# Patient Record
Sex: Female | Born: 1970 | Race: Black or African American | Hispanic: No | Marital: Married | Smoking: Never smoker
Health system: Southern US, Community
[De-identification: ages and names within clinical notes are randomized; demographics above are authoritative.]

## PROBLEM LIST (undated history)

## (undated) DIAGNOSIS — I10 Essential (primary) hypertension: Secondary | ICD-10-CM

## (undated) DIAGNOSIS — E669 Obesity, unspecified: Secondary | ICD-10-CM

## (undated) DIAGNOSIS — Z87448 Personal history of other diseases of urinary system: Secondary | ICD-10-CM

---
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
## 1998-10-08 ENCOUNTER — Emergency Department (HOSPITAL_COMMUNITY): Admission: EM | Admit: 1998-10-08 | Discharge: 1998-10-08 | Payer: Self-pay | Admitting: Emergency Medicine

## 1998-10-08 ENCOUNTER — Encounter: Payer: Self-pay | Admitting: Emergency Medicine

## 2000-05-27 ENCOUNTER — Other Ambulatory Visit: Admission: RE | Admit: 2000-05-27 | Discharge: 2000-05-27 | Payer: Self-pay | Admitting: Obstetrics and Gynecology

## 2000-07-05 ENCOUNTER — Ambulatory Visit (HOSPITAL_COMMUNITY): Admission: RE | Admit: 2000-07-05 | Discharge: 2000-07-05 | Payer: Self-pay | Admitting: Obstetrics and Gynecology

## 2000-07-05 ENCOUNTER — Encounter: Payer: Self-pay | Admitting: Obstetrics and Gynecology

## 2000-08-03 ENCOUNTER — Encounter: Payer: Self-pay | Admitting: Obstetrics and Gynecology

## 2000-08-03 ENCOUNTER — Ambulatory Visit (HOSPITAL_COMMUNITY): Admission: RE | Admit: 2000-08-03 | Discharge: 2000-08-03 | Payer: Self-pay | Admitting: Obstetrics and Gynecology

## 2000-10-06 ENCOUNTER — Encounter: Payer: Self-pay | Admitting: Obstetrics and Gynecology

## 2000-10-06 ENCOUNTER — Ambulatory Visit (HOSPITAL_COMMUNITY): Admission: RE | Admit: 2000-10-06 | Discharge: 2000-10-06 | Payer: Self-pay | Admitting: Obstetrics and Gynecology

## 2000-10-17 ENCOUNTER — Inpatient Hospital Stay (HOSPITAL_COMMUNITY): Admission: AD | Admit: 2000-10-17 | Discharge: 2000-10-19 | Payer: Self-pay | Admitting: Obstetrics and Gynecology

## 2001-12-21 HISTORY — PX: ABDOMINAL HYSTERECTOMY: SHX81

## 2003-01-23 ENCOUNTER — Encounter: Admission: RE | Admit: 2003-01-23 | Discharge: 2003-01-23 | Payer: Self-pay | Admitting: Obstetrics and Gynecology

## 2003-01-25 ENCOUNTER — Ambulatory Visit (HOSPITAL_COMMUNITY): Admission: RE | Admit: 2003-01-25 | Discharge: 2003-01-25 | Payer: Self-pay | Admitting: *Deleted

## 2003-02-13 ENCOUNTER — Encounter: Admission: RE | Admit: 2003-02-13 | Discharge: 2003-02-13 | Payer: Self-pay | Admitting: Obstetrics and Gynecology

## 2003-03-13 ENCOUNTER — Encounter: Admission: RE | Admit: 2003-03-13 | Discharge: 2003-03-13 | Payer: Self-pay | Admitting: Obstetrics and Gynecology

## 2003-05-03 ENCOUNTER — Inpatient Hospital Stay (HOSPITAL_COMMUNITY): Admission: RE | Admit: 2003-05-03 | Discharge: 2003-05-05 | Payer: Self-pay | Admitting: Obstetrics and Gynecology

## 2003-05-08 ENCOUNTER — Inpatient Hospital Stay (HOSPITAL_COMMUNITY): Admission: AD | Admit: 2003-05-08 | Discharge: 2003-05-08 | Payer: Self-pay | Admitting: *Deleted

## 2004-06-21 ENCOUNTER — Emergency Department (HOSPITAL_COMMUNITY): Admission: EM | Admit: 2004-06-21 | Discharge: 2004-06-21 | Payer: Self-pay | Admitting: Emergency Medicine

## 2007-04-14 ENCOUNTER — Emergency Department (HOSPITAL_COMMUNITY): Admission: EM | Admit: 2007-04-14 | Discharge: 2007-04-14 | Payer: Self-pay | Admitting: Family Medicine

## 2013-08-08 ENCOUNTER — Emergency Department (HOSPITAL_COMMUNITY)
Admission: EM | Admit: 2013-08-08 | Discharge: 2013-08-08 | Disposition: A | Discharge status: Home / Self Care | Payer: 59 | Attending: Emergency Medicine | Admitting: Emergency Medicine

## 2013-08-08 ENCOUNTER — Emergency Department (HOSPITAL_COMMUNITY): Payer: 59

## 2013-08-08 ENCOUNTER — Encounter (HOSPITAL_COMMUNITY): Payer: Self-pay | Admitting: *Deleted

## 2013-08-08 DIAGNOSIS — Z90710 Acquired absence of both cervix and uterus: Secondary | ICD-10-CM | POA: Insufficient documentation

## 2013-08-08 DIAGNOSIS — I10 Essential (primary) hypertension: Secondary | ICD-10-CM | POA: Insufficient documentation

## 2013-08-08 DIAGNOSIS — Z91199 Patient's noncompliance with other medical treatment and regimen due to unspecified reason: Secondary | ICD-10-CM | POA: Insufficient documentation

## 2013-08-08 DIAGNOSIS — K8020 Calculus of gallbladder without cholecystitis without obstruction: Secondary | ICD-10-CM | POA: Insufficient documentation

## 2013-08-08 DIAGNOSIS — R112 Nausea with vomiting, unspecified: Secondary | ICD-10-CM | POA: Insufficient documentation

## 2013-08-08 DIAGNOSIS — Z9119 Patient's noncompliance with other medical treatment and regimen: Secondary | ICD-10-CM | POA: Insufficient documentation

## 2013-08-08 HISTORY — DX: Essential (primary) hypertension: I10

## 2013-08-08 LAB — COMPREHENSIVE METABOLIC PANEL
AST: 22 U/L (ref 0–37)
Albumin: 3.9 g/dL (ref 3.5–5.2)
Alkaline Phosphatase: 81 U/L (ref 39–117)
BUN: 10 mg/dL (ref 6–23)
Creatinine, Ser: 0.84 mg/dL (ref 0.50–1.10)
Potassium: 3.3 mEq/L — ABNORMAL LOW (ref 3.5–5.1)
Total Protein: 8.1 g/dL (ref 6.0–8.3)

## 2013-08-08 LAB — URINE MICROSCOPIC-ADD ON

## 2013-08-08 LAB — URINALYSIS, ROUTINE W REFLEX MICROSCOPIC
Glucose, UA: NEGATIVE mg/dL
Leukocytes, UA: NEGATIVE
Nitrite: NEGATIVE
Specific Gravity, Urine: 1.011 (ref 1.005–1.030)
pH: 7.5 (ref 5.0–8.0)

## 2013-08-08 LAB — CBC WITH DIFFERENTIAL/PLATELET
Basophils Absolute: 0 10*3/uL (ref 0.0–0.1)
Basophils Relative: 0 % (ref 0–1)
Eosinophils Absolute: 0 10*3/uL (ref 0.0–0.7)
Hemoglobin: 12.7 g/dL (ref 12.0–15.0)
MCH: 28.4 pg (ref 26.0–34.0)
MCHC: 33.2 g/dL (ref 30.0–36.0)
Monocytes Relative: 5 % (ref 3–12)
Neutro Abs: 7.4 10*3/uL (ref 1.7–7.7)
Neutrophils Relative %: 76 % (ref 43–77)
RDW: 13.9 % (ref 11.5–15.5)

## 2013-08-08 LAB — LIPASE, BLOOD: Lipase: 27 U/L (ref 11–59)

## 2013-08-08 MED ORDER — ONDANSETRON HCL 4 MG PO TABS: 4.0000 mg | ORAL_TABLET | Freq: Three times a day (TID) | ORAL | Status: DC | PRN

## 2013-08-08 MED ORDER — ONDANSETRON HCL 4 MG/2ML IJ SOLN
4.0000 mg | Freq: Once | INTRAMUSCULAR | Status: AC
  Administered 2013-08-08: 4 mg via INTRAVENOUS
  Filled 2013-08-08: qty 2

## 2013-08-08 MED ORDER — HYDROMORPHONE HCL PF 1 MG/ML IJ SOLN
1.0000 mg | Freq: Once | INTRAMUSCULAR | Status: AC
  Administered 2013-08-08: 1 mg via INTRAVENOUS
  Filled 2013-08-08: qty 1

## 2013-08-08 MED ORDER — OXYCODONE-ACETAMINOPHEN 5-325 MG PO TABS: 1.0000 | ORAL_TABLET | Freq: Four times a day (QID) | ORAL | Status: DC | PRN

## 2013-08-08 MED ORDER — SODIUM CHLORIDE 0.9 % IV BOLUS (SEPSIS)
1000.0000 mL | Freq: Once | INTRAVENOUS | Status: AC
  Administered 2013-08-08: 1000 mL via INTRAVENOUS

## 2013-08-08 NOTE — ED Provider Notes (Signed)
CSN: [id number]     Arrival date & time 08/08/13  0219 History     First MD Initiated Contact with Patient 08/08/13 0224     Chief Complaint  Patient presents with  . Abdominal Pain   (Consider location/radiation/quality/duration/timing/severity/associated sxs/prior Treatment) Patient is a 42 y.o. female presenting with abdominal pain.  Abdominal Pain  Pt with history of HTN (noncompliant with meds) reports sudden onset of severe aching gnawing epigastric pain around 1800hrs radiating into back, associated with N/V but no diarrhea or dysuria. Unable to find a comfortable position. No history of same, drank wine over the weekend but is not an every day drinker. Denies any known history of gall stones. No fever.   Past Medical History  Diagnosis Date  . Hypertension    Past Surgical History  Procedure Laterality Date  . Abdominal hysterectomy     History reviewed. No pertinent family history. History  Substance Use Topics  . Smoking status: Never Smoker   . Smokeless tobacco: Not on file  . Alcohol Use: Yes   OB History   Grav Para Term Preterm Abortions TAB SAB Ect Mult Living                 Review of Systems  Gastrointestinal: Positive for abdominal pain.   All other systems reviewed and are negative except as noted in HPI.   Allergies  Review of patient's allergies indicates no known allergies.  Home Medications  No current outpatient prescriptions on file. BP 153/103  Pulse 76  Temp(Src) 97.7 F (36.5 C) (Oral)  Resp 16  SpO2 99% Physical Exam  Nursing note and vitals reviewed. Constitutional: She is oriented to person, place, and time. She appears well-developed and well-nourished.  HENT:  Head: Normocephalic and atraumatic.  Eyes: EOM are normal. Pupils are equal, round, and reactive to light.  Neck: Normal range of motion. Neck supple.  Cardiovascular: Normal rate, normal heart sounds and intact distal pulses.   Pulmonary/Chest: Effort normal and  breath sounds normal.  Abdominal: Bowel sounds are normal. She exhibits no distension. There is tenderness (epigastric moderate, RUQ mild, neg Murphy's sign). There is guarding. There is no rebound.  Musculoskeletal: Normal range of motion. She exhibits no edema and no tenderness.  Neurological: She is alert and oriented to person, place, and time. She has normal strength. No cranial nerve deficit or sensory deficit.  Skin: Skin is warm and dry. No rash noted.  Psychiatric: She has a normal mood and affect.    ED Course   Procedures (including critical care time)  Labs Reviewed  COMPREHENSIVE METABOLIC PANEL - Abnormal; Notable for the following:    Potassium 3.3 (*)    Glucose, Bld 125 (*)    GFR calc non Af Amer 86 (*)    All other components within normal limits  URINALYSIS, ROUTINE W REFLEX MICROSCOPIC - Abnormal; Notable for the following:    Hgb urine dipstick MODERATE (*)    Protein, ur 100 (*)    All other components within normal limits  URINE MICROSCOPIC-ADD ON - Abnormal; Notable for the following:    Squamous Epithelial / LPF FEW (*)    Bacteria, UA FEW (*)    All other components within normal limits  CBC WITH DIFFERENTIAL  LIPASE, BLOOD   US Abdomen Complete  08/08/2013   *RADIOLOGY REPORT*  Clinical Data:  Epigastric pain.  COMPLETE ABDOMINAL ULTRASOUND  Comparison:  None.  Findings:  Gallbladder:  Multiple stones in the dependent portion of  the gallbladder.  No gallbladder wall thickening or edema.  Murphy's sign is negative.  Common bile duct:  Normal caliber.  Diameter is measured at 4.6 mm.  Liver:  Mild increased liver echotexture diffusely suggesting fatty infiltration.  No focal lesions identified.  IVC:  Appears normal.  Pancreas:  No focal abnormality seen.  Spleen:  Spleen length measures 8.6 cm.  Normal parenchymal echotexture.  Right Kidney:  Right kidney measures 12.5 cm length.  No hydronephrosis.  Left Kidney:  Left kidney measures 12 cm length.  No  hydronephrosis.  Abdominal aorta:  Visualized abdominal aorta is normal in caliber. Distally aorta is obscured by bowel gas.  IMPRESSION: Cholelithiasis.  Probable fatty infiltration of the liver.   Original Report Authenticated By: Burman Nieves, M.D.   1. Cholelithiasis     MDM   Date: 08/08/2013  Rate: 78  Rhythm: normal sinus rhythm  QRS Axis: normal  Intervals: normal  ST/T Wave abnormalities: nonspecific T wave changes  Conduction Disutrbances:none  Narrative Interpretation:   Old EKG Reviewed: none available  Pain improved. Labs unremarkable, US shows gall stones but no signs of cholecystitis. Advised to followup with Gen Surg.    Charles B. Bernette Mayers, MD 08/08/13 [phone]

## 2013-08-08 NOTE — ED Notes (Signed)
Patient provided urine sample without catheterization. Sample sent to lab.

## 2013-08-08 NOTE — ED Notes (Signed)
Pt complaining of pain that starts in her back and radiates to her epigastric region. Pt states that the pain has been going on since 18:00 last night. Pt states vomiting, no SOB. Pt states has had reflux before and this pain has lasted longer. Pt out of BP medications.

## 2013-08-17 ENCOUNTER — Ambulatory Visit (INDEPENDENT_AMBULATORY_CARE_PROVIDER_SITE_OTHER): Payer: 59 | Admitting: Surgery

## 2013-08-17 ENCOUNTER — Encounter (INDEPENDENT_AMBULATORY_CARE_PROVIDER_SITE_OTHER): Payer: Self-pay | Admitting: Surgery

## 2013-08-17 VITALS — BP 152/96 | HR 72 | Resp 16 | Ht 67.5 in | Wt 331.0 lb

## 2013-08-17 DIAGNOSIS — K8020 Calculus of gallbladder without cholecystitis without obstruction: Secondary | ICD-10-CM

## 2013-08-17 NOTE — Patient Instructions (Signed)

## 2013-08-17 NOTE — Progress Notes (Signed)
Patient ID: Allison Mays, female   DOB: June 19, 1971, 42 y.o.   MRN: [id number]  No chief complaint on file.   HPI Allison Mays is a 42 y.o. female.  Patient sent at the request of Dr. Bernette Mayers for right upper quadrant pain. She was seen 10 days ago with the sudden onset of sharp epigastric and right upper quadrant pain. Ultrasound scan which showed gallstones. She was managed medically and sent home and has done well since. She has had no significant episodes of pain like this before. She had some heartburn Christmastime which he thinks may have been related to this as well. She is asymptomatic currently. Symptoms not made worse by dietary changes. HPI  Past Medical History  Diagnosis Date  . Hypertension     Past Surgical History  Procedure Laterality Date  . Abdominal hysterectomy      Family History  Problem Relation Age of Onset  . Anuerysm Father     Social History History  Substance Use Topics  . Smoking status: Never Smoker   . Smokeless tobacco: Not on file  . Alcohol Use: Yes    No Known Allergies  No current outpatient prescriptions on file.   No current facility-administered medications for this visit.    Review of Systems Review of Systems  Constitutional: Negative for fever, chills and unexpected weight change.  HENT: Negative for hearing loss, congestion, sore throat, trouble swallowing and voice change.   Eyes: Negative for visual disturbance.  Respiratory: Negative for cough and wheezing.   Cardiovascular: Negative for chest pain, palpitations and leg swelling.  Gastrointestinal: Negative for nausea, vomiting, abdominal pain, diarrhea, constipation, blood in stool, abdominal distention and anal bleeding.  Genitourinary: Negative for hematuria, vaginal bleeding and difficulty urinating.  Musculoskeletal: Negative for arthralgias.  Skin: Negative for rash and wound.  Neurological: Negative for seizures, syncope and headaches.  Hematological:  Negative for adenopathy. Does not bruise/bleed easily.  Psychiatric/Behavioral: Negative for confusion.    Blood pressure 152/96, pulse 72, resp. rate 16, height 5' 7.5" (1.715 m), weight 331 lb (150.141 kg).  Physical Exam Physical Exam  Constitutional: She is oriented to person, place, and time. She appears well-developed and well-nourished.  HENT:  Head: Normocephalic and atraumatic.  Eyes: EOM are normal. Pupils are equal, round, and reactive to light.  Neck: Normal range of motion. Neck supple.  Cardiovascular: Normal rate and regular rhythm.   Pulmonary/Chest: Effort normal and breath sounds normal.  Abdominal: Soft. Bowel sounds are normal. There is no tenderness.  Musculoskeletal: Normal range of motion.  Neurological: She is alert and oriented to person, place, and time.  Skin: Skin is warm and dry.  Psychiatric: She has a normal mood and affect. Her behavior is normal. Judgment and thought content normal.    Data Reviewed  Clinical Data: Epigastric pain.  COMPLETE ABDOMINAL ULTRASOUND  Comparison: None.  Findings:  Gallbladder: Multiple stones in the dependent portion of the  gallbladder. No gallbladder wall thickening or edema. Murphy's  sign is negative.  Common bile duct: Normal caliber. Diameter is measured at 4.6 mm.  Liver: Mild increased liver echotexture diffusely suggesting fatty  infiltration. No focal lesions identified.  IVC: Appears normal.  Pancreas: No focal abnormality seen.  Spleen: Spleen length measures 8.6 cm. Normal parenchymal  echotexture.  Right Kidney: Right kidney measures 12.5 cm length. No  hydronephrosis.  Left Kidney: Left kidney measures 12 cm length. No  hydronephrosis.  Abdominal aorta: Visualized abdominal aorta is normal in caliber.  Distally aorta is obscured by bowel gas.  IMPRESSION:  Cholelithiasis. Probable fatty infiltration of the liver.  Original Report Authenticated By: Burman Nieves, M.D.   Assessment     Gallstones symptomatic    Plan    Discussed operative and nonoperative management of the patient's condition today. She's only had one attack and therefore would like to watch and try a low-fat diet for now. Discussed surgery with her as well. Patient will check her work schedule and decide later time if he wishes to proceed with cholecystectomy or not.       Jerrit Horen A. 08/17/2013, 2:06 PM

## 2013-09-06 ENCOUNTER — Other Ambulatory Visit (INDEPENDENT_AMBULATORY_CARE_PROVIDER_SITE_OTHER): Payer: Self-pay | Admitting: Surgery

## 2013-09-06 NOTE — Progress Notes (Signed)
The procedure has been discussed with the patient. Operative and non operative treatments have been discussed. Risks of surgery include bleeding, infection,  Common bile duct injury,  Injury to the stomach,liver, colon,small intestine, abdominal wall,  Diaphragm,  Major blood vessels,  And the need for an open procedure.  Other risks include worsening of medical problems, death,  DVT and pulmonary embolism, and cardiovascular events.   Medical options have also been discussed. The patient has been informed of long term expectations of surgery and non surgical options,  The patient agrees to proceed.   

## 2013-09-11 ENCOUNTER — Other Ambulatory Visit: Payer: Self-pay | Admitting: Internal Medicine

## 2013-09-11 DIAGNOSIS — Z1231 Encounter for screening mammogram for malignant neoplasm of breast: Secondary | ICD-10-CM

## 2013-09-13 ENCOUNTER — Telehealth (INDEPENDENT_AMBULATORY_CARE_PROVIDER_SITE_OTHER): Payer: Self-pay | Admitting: Surgery

## 2013-09-13 NOTE — Telephone Encounter (Signed)
Contacted patient by telephone about surgery, gave finical responsibilities, patient will call back to schedule, face sheet placed in pending folder

## 2013-09-18 ENCOUNTER — Ambulatory Visit: Payer: 59

## 2013-10-06 ENCOUNTER — Ambulatory Visit: Payer: 59

## 2014-04-03 ENCOUNTER — Emergency Department (HOSPITAL_COMMUNITY): Payer: 59

## 2014-04-03 ENCOUNTER — Encounter (HOSPITAL_COMMUNITY): Payer: Self-pay | Admitting: Emergency Medicine

## 2014-04-03 DIAGNOSIS — Y929 Unspecified place or not applicable: Secondary | ICD-10-CM | POA: Insufficient documentation

## 2014-04-03 DIAGNOSIS — S99919A Unspecified injury of unspecified ankle, initial encounter: Secondary | ICD-10-CM

## 2014-04-03 DIAGNOSIS — W1809XA Striking against other object with subsequent fall, initial encounter: Secondary | ICD-10-CM | POA: Insufficient documentation

## 2014-04-03 DIAGNOSIS — S0990XA Unspecified injury of head, initial encounter: Secondary | ICD-10-CM | POA: Insufficient documentation

## 2014-04-03 DIAGNOSIS — I10 Essential (primary) hypertension: Secondary | ICD-10-CM | POA: Insufficient documentation

## 2014-04-03 DIAGNOSIS — S99929A Unspecified injury of unspecified foot, initial encounter: Secondary | ICD-10-CM

## 2014-04-03 DIAGNOSIS — Z79899 Other long term (current) drug therapy: Secondary | ICD-10-CM | POA: Insufficient documentation

## 2014-04-03 DIAGNOSIS — S8990XA Unspecified injury of unspecified lower leg, initial encounter: Secondary | ICD-10-CM | POA: Insufficient documentation

## 2014-04-03 DIAGNOSIS — W010XXA Fall on same level from slipping, tripping and stumbling without subsequent striking against object, initial encounter: Secondary | ICD-10-CM | POA: Insufficient documentation

## 2014-04-03 DIAGNOSIS — Y939 Activity, unspecified: Secondary | ICD-10-CM | POA: Insufficient documentation

## 2014-04-03 NOTE — ED Notes (Signed)
Pt reports slipping on puddle at 2015 tonight hitting back of head and left knee. Denies LOC. Pt now reports 6/10 to back of head and 7/10 left knee pain. Pt ambulatory to triage, but reports pain with ambulation. Pulses intact. PERRLA, 5 mm. Hx: HTN, denies taking meds today

## 2014-04-04 ENCOUNTER — Emergency Department (HOSPITAL_COMMUNITY)
Admission: EM | Admit: 2014-04-04 | Discharge: 2014-04-04 | Disposition: A | Discharge status: Home / Self Care | Payer: 59 | Attending: Emergency Medicine | Admitting: Emergency Medicine

## 2014-04-04 DIAGNOSIS — S0990XA Unspecified injury of head, initial encounter: Secondary | ICD-10-CM

## 2014-04-04 DIAGNOSIS — M25562 Pain in left knee: Secondary | ICD-10-CM

## 2014-04-04 DIAGNOSIS — W19XXXA Unspecified fall, initial encounter: Secondary | ICD-10-CM

## 2014-04-04 MED ORDER — ACETAMINOPHEN-CODEINE #3 300-30 MG PO TABS
1.0000 | ORAL_TABLET | Freq: Once | ORAL | Status: AC
Start: 2014-04-04 — End: 2014-04-04
  Administered 2014-04-04: 1 via ORAL
  Filled 2014-04-04: qty 1

## 2014-04-04 MED ORDER — IBUPROFEN 800 MG PO TABS: 800.0000 mg | ORAL_TABLET | Freq: Three times a day (TID) | ORAL | Status: DC

## 2014-04-04 MED ORDER — ACETAMINOPHEN-CODEINE #3 300-30 MG PO TABS: 1.0000 | ORAL_TABLET | Freq: Four times a day (QID) | ORAL | Status: DC | PRN

## 2014-04-04 NOTE — ED Notes (Signed)
Pt reports slipping on water last evening and falling backwards. States she hit back of her head on tile flooring and twisted left knee. Reports left knee pain and headache. Pt denies LOC. Pt alert and oriented x 4, neuro intact.

## 2014-04-04 NOTE — ED Provider Notes (Signed)
CSN: [id number]     Arrival date & time 04/03/14  2131 History   First MD Initiated Contact with Patient 04/04/14 0129     Chief Complaint  Patient presents with  . Fall     (Consider location/radiation/quality/duration/timing/severity/associated sxs/prior Treatment) HPI Comments: Patient is a 43 yo F PMHx significant for HTN presenting to the ED after a fall earlier today, 8:15PM. She slipped in a puddle causing her to fall on her back. She hit her head, but did not have LOC. No visual disturbances, emesis, or focal complaints since hitting her head. She states she also hit her right knee and is having anterior non-radiating severe aching pain. Patient does not take any blood thinners. She did not take her HTN medications today.   Patient is a 43 y.o. female presenting with fall.  Fall Associated symptoms include arthralgias, headaches and joint swelling. Pertinent negatives include no abdominal pain, chest pain, chills, fever, nausea, numbness, vomiting or weakness.    Past Medical History  Diagnosis Date  . Hypertension    Past Surgical History  Procedure Laterality Date  . Abdominal hysterectomy     Family History  Problem Relation Age of Onset  . Anuerysm Father    History  Substance Use Topics  . Smoking status: Never Smoker   . Smokeless tobacco: Not on file  . Alcohol Use: Yes   OB History   Grav Para Term Preterm Abortions TAB SAB Ect Mult Living   1 1  1  0   0  1     Review of Systems  Constitutional: Negative for fever and chills.  Eyes: Negative for visual disturbance.  Respiratory: Negative for shortness of breath.   Cardiovascular: Negative for chest pain.  Gastrointestinal: Negative for nausea, vomiting and abdominal pain.  Musculoskeletal: Positive for arthralgias and joint swelling.  Neurological: Positive for headaches. Negative for dizziness, syncope, weakness, light-headedness and numbness.  All other systems reviewed and are  negative.     Allergies  Review of patient's allergies indicates no known allergies.  Home Medications   Prior to Admission medications   Medication Sig Start Date End Date Taking? Authorizing Provider  hydrochlorothiazide (HYDRODIURIL) 25 MG tablet Take 25 mg by mouth daily.   Yes Historical Provider, MD  ibuprofen (ADVIL,MOTRIN) 200 MG tablet Take 200 mg by mouth every 6 (six) hours as needed for mild pain.   Yes Historical Provider, MD   BP 172/125  Pulse 86  Temp(Src) 97.2 F (36.2 C) (Oral)  Resp 16  SpO2 95% Physical Exam  Nursing note and vitals reviewed. Constitutional: She is oriented to person, place, and time. She appears well-developed and well-nourished. No distress.  HENT:  Head: Normocephalic and atraumatic.  Right Ear: External ear normal.  Left Ear: External ear normal.  Nose: Nose normal.  Mouth/Throat: Oropharynx is clear and moist. No oropharyngeal exudate.  Eyes: Conjunctivae and EOM are normal. Pupils are equal, round, and reactive to light.  Neck: Normal range of motion. Neck supple.  Cardiovascular: Normal rate, regular rhythm, normal heart sounds and intact distal pulses.   Pulmonary/Chest: Effort normal and breath sounds normal. No respiratory distress.  Abdominal: Soft. There is no tenderness.  Musculoskeletal: Normal range of motion.       Right knee: Normal.       Left knee: She exhibits swelling. She exhibits normal range of motion, no effusion, no ecchymosis, no deformity, no laceration and no erythema. Tenderness found.       Right ankle: Normal.  Left ankle: Normal.       Right lower leg: Normal.       Left lower leg: Normal.       Right foot: Normal.       Left foot: Normal.  Neurological: She is alert and oriented to person, place, and time. She has normal strength. No cranial nerve deficit. Gait normal. GCS eye subscore is 4. GCS verbal subscore is 5. GCS motor subscore is 6.  Sensation grossly intact.  No pronator drift.   Bilateral heel-knee-shin intact.  Skin: Skin is warm and dry. She is not diaphoretic.    ED Course  Procedures (including critical care time) Medications  acetaminophen-codeine (TYLENOL #3) 300-30 MG per tablet 1 tablet (1 tablet Oral Given 04/04/14 0152)    Labs Review Labs Reviewed - No data to display  Imaging Review Dg Knee Complete 4 Views Left  04/03/2014   CLINICAL DATA:  Fall with left knee pain.  EXAM: LEFT KNEE - COMPLETE 4+ VIEW  COMPARISON:  None.  FINDINGS: There is no evidence of fracture, dislocation, or joint effusion. 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 No bony lesions. Soft tissues are unremarkable.  IMPRESSION: No acute fracture.  Mild medial joint space narrowing.   Electronically Signed   By: Irish LackGlenn  Yamagata M.D.   On: 04/03/2014 22:49     EKG Interpretation None      MDM   Final diagnoses:  Head injury, acute, without loss of consciousness  Left knee pain  Fall    Filed Vitals:   04/04/14 0204  BP:   Pulse: 86  Temp: 97.2 F (36.2 C)  Resp: 16   Afebrile, NAD, non-toxic appearing, AAOx4. No neurofocal deficits on examination. Normocephalic atraumatic. GCS 15, A&Ox4, no bleeding from the head, battle signs, or clear discharge resembling CSF fluid. Left knee minimal swelling, tender to palpation, no obvious deformity or defect. Range of motion intact. Sensation intact. Noted to be hypertensive in the emergency department, history of hypertension and did not take antihypertensive medications this morning. Pain managed in the ED. At this time there does not appear to be any evidence of an acute emergency medical condition and the patient appears stable for discharge with appropriate outpatient follow up. Discussed returning to the ED upon presentation of any concerning symptoms. Patient is stable at time of discharge.       Jeannetta EllisJennifer L Kamill Fulbright, PA-C 04/04/14 [id number]

## 2014-04-04 NOTE — Discharge Instructions (Signed)
Please follow up with your primary care physician in 1-2 days. If you do not have one [facility] listed above. Please take pain medication and/or muscle relaxants as prescribed and as needed for pain. Please do not drive on narcotic pain medication or on muscle relaxants. Please follow RICE method below for your knee. Please read all discharge instructions and return precautions.   Head Injury, Adult You have received a head injury. It does not appear serious at this time. Headaches and vomiting are common following head injury. It should be easy to awaken from sleeping. Sometimes it is necessary for you to stay in the emergency department for a while for observation. Sometimes admission to the hospital may be needed. After injuries such as yours, most problems occur within the first 24 hours, but side effects may occur up to 7 10 days after the injury. It is important for you to carefully monitor your condition and contact your health care provider or seek immediate medical care if there is a change in your condition. WHAT ARE THE TYPES OF HEAD INJURIES? Head injuries can be as minor as a bump. Some head injuries can be more severe. More severe head injuries include:  A jarring injury to the brain (concussion).  A bruise of the brain (contusion). This mean there is bleeding in the brain that can cause swelling.  A cracked skull (skull fracture).  Bleeding in the brain that collects, clots, and forms a bump (hematoma). WHAT CAUSES A HEAD INJURY? A serious head injury is most likely to happen to someone who is in a car wreck and is not wearing a seat belt. Other causes of major head injuries include bicycle or motorcycle accidents, sports injuries, and falls. HOW ARE HEAD INJURIES DIAGNOSED? A complete history of the event leading to the injury and your current symptoms will be helpful in diagnosing head injuries. Many times, pictures of the brain, such as  CT or MRI are needed to see the extent of the injury. Often, an overnight hospital stay is necessary for observation.  WHEN SHOULD I SEEK IMMEDIATE MEDICAL CARE?  You should get help right away if:  You have confusion or drowsiness.  You feel sick to your stomach (nauseous) or have continued, forceful vomiting.  You have dizziness or unsteadiness that is getting worse.  You have severe, continued headaches not relieved by medicine. Only take over-the-counter or prescription medicines for pain, fever, or discomfort as directed by your health care provider.  You do not have normal function of the arms or legs or are unable to walk.  You notice changes in the black spots in the center of the colored part of your eye (pupil).  You have a clear or bloody fluid coming from your nose or ears.  You have a loss of vision. During the next 24 hours after the injury, you must stay with someone who can watch you for the warning signs. This person should contact local emergency services (911 in the [place] if you have seizures, you become unconscious, or you are unable to wake up. HOW CAN I PREVENT A HEAD INJURY IN THE FUTURE? The most important factor for preventing major head injuries is avoiding motor vehicle accidents. To minimize the potential for damage to your head, it is crucial to wear seat belts while riding in motor vehicles. Wearing helmets while bike riding and playing collision sports (like football) is also helpful. Also, avoiding dangerous activities around the  house will further help reduce your risk of head injury.  WHEN CAN I RETURN TO NORMAL ACTIVITIES AND ATHLETICS? You should be reevaluated by your health care provider before returning to these activities. If you have any of the following symptoms, you should not return to activities or contact sports until 1 week after the symptoms have stopped:  Persistent headache.  Dizziness or vertigo.  Poor attention and  concentration.  Confusion.  Memory problems.  Nausea or vomiting.  Fatigue or tire easily.  Irritability.  Intolerant of bright lights or loud noises.  Anxiety or depression.  Disturbed sleep. MAKE SURE YOU:   Understand these instructions.  Will watch your condition.  Will get help right away if you are not doing well or get worse. Document Released: 12/07/2005 Document Revised: 09/27/2013 Document Reviewed: 08/14/2013 [facility] Patient Information 2014 [place], [place]. RICE: Routine Care for Injuries The routine care of many injuries includes Rest, Ice, Compression, and Elevation (RICE). HOME CARE INSTRUCTIONS  Rest is needed to allow your body to heal. Routine activities can usually be resumed when comfortable. Injured tendons and bones can take up to 6 weeks to heal. Tendons are the cord-like structures that attach muscle to bone.  Ice following an injury helps keep the swelling down and reduces pain.  Put ice in a plastic bag.  Place a towel between your skin and the bag.  Leave the ice on for 15-20 minutes, 03-04 times a day. Do this while awake, for the first 24 to 48 hours. After that, continue as directed by your caregiver.  Compression helps keep swelling down. It also gives support and helps with discomfort. If an elastic bandage has been applied, it should be removed and reapplied every 3 to 4 hours. It should not be applied tightly, but firmly enough to keep swelling down. Watch fingers or toes for swelling, bluish discoloration, coldness, numbness, or excessive pain. If any of these problems occur, remove the bandage and reapply loosely. Contact your caregiver if these problems continue.  Elevation helps reduce swelling and decreases pain. With extremities, such as the arms, hands, legs, and feet, the injured area should be placed near or above the level of the heart, if possible. SEEK IMMEDIATE MEDICAL CARE IF:  You have persistent pain and swelling.  You  develop redness, numbness, or unexpected weakness.  Your symptoms are getting worse rather than improving after several days. These symptoms may indicate that further evaluation or further X-rays are needed. Sometimes, X-rays may not show a small broken bone (fracture) until 1 week or 10 days later. Make a follow-up appointment with your caregiver. Ask when your X-ray results will be ready. Make sure you get your X-ray results. Document Released: 03/21/2001 Document Revised: 02/29/2012 Document Reviewed: 05/08/2011 [facility] Patient Information 2014 [place], [place].

## 2014-04-04 NOTE — ED Provider Notes (Signed)
Medical screening examination/treatment/procedure(s) were performed by non-physician practitioner and as supervising physician I was immediately available for consultation/collaboration.   EKG Interpretation None        Loren Raceravid Diondra Pines, MD 04/04/14 [phone]

## 2014-10-05 ENCOUNTER — Other Ambulatory Visit: Payer: Self-pay

## 2014-10-22 ENCOUNTER — Encounter (HOSPITAL_COMMUNITY): Payer: Self-pay | Admitting: Emergency Medicine

## 2015-07-01 ENCOUNTER — Encounter (HOSPITAL_COMMUNITY): Payer: Self-pay | Admitting: Emergency Medicine

## 2015-07-01 DIAGNOSIS — Z6841 Body Mass Index (BMI) 40.0 and over, adult: Secondary | ICD-10-CM | POA: Diagnosis not present

## 2015-07-01 DIAGNOSIS — K8012 Calculus of gallbladder with acute and chronic cholecystitis without obstruction: Secondary | ICD-10-CM | POA: Diagnosis not present

## 2015-07-01 DIAGNOSIS — R1013 Epigastric pain: Secondary | ICD-10-CM | POA: Diagnosis present

## 2015-07-01 DIAGNOSIS — I10 Essential (primary) hypertension: Secondary | ICD-10-CM | POA: Diagnosis not present

## 2015-07-01 DIAGNOSIS — Q444 Choledochal cyst: Secondary | ICD-10-CM | POA: Insufficient documentation

## 2015-07-01 NOTE — ED Notes (Signed)
Pt. reports pain across her abdomen and mid back pain with nausea and vomitting onset this evening .

## 2015-07-02 ENCOUNTER — Encounter (HOSPITAL_COMMUNITY): Payer: Self-pay | Admitting: Certified Registered Nurse Anesthetist

## 2015-07-02 ENCOUNTER — Observation Stay (HOSPITAL_COMMUNITY): Payer: 59 | Admitting: Anesthesiology

## 2015-07-02 ENCOUNTER — Encounter (HOSPITAL_COMMUNITY)
Admission: EM | Disposition: A | Discharge status: Home / Self Care | Payer: Self-pay | Source: Home / Self Care | Attending: Emergency Medicine

## 2015-07-02 ENCOUNTER — Emergency Department (HOSPITAL_COMMUNITY): Payer: 59

## 2015-07-02 ENCOUNTER — Observation Stay (HOSPITAL_COMMUNITY)
Admission: EM | Admit: 2015-07-02 | Discharge: 2015-07-03 | Disposition: A | Discharge status: Home / Self Care | Payer: 59 | Attending: Surgery | Admitting: Surgery

## 2015-07-02 DIAGNOSIS — R1013 Epigastric pain: Secondary | ICD-10-CM

## 2015-07-02 DIAGNOSIS — K819 Cholecystitis, unspecified: Secondary | ICD-10-CM

## 2015-07-02 DIAGNOSIS — K810 Acute cholecystitis: Secondary | ICD-10-CM | POA: Diagnosis present

## 2015-07-02 DIAGNOSIS — K8020 Calculus of gallbladder without cholecystitis without obstruction: Secondary | ICD-10-CM | POA: Diagnosis present

## 2015-07-02 HISTORY — PX: CHOLECYSTECTOMY: SHX55

## 2015-07-02 HISTORY — DX: Obesity, unspecified: E66.9

## 2015-07-02 LAB — URINALYSIS, ROUTINE W REFLEX MICROSCOPIC
Bilirubin Urine: NEGATIVE
Glucose, UA: NEGATIVE mg/dL
Ketones, ur: 15 mg/dL — AB
Leukocytes, UA: NEGATIVE
Nitrite: NEGATIVE
Protein, ur: NEGATIVE mg/dL
Specific Gravity, Urine: 1.027 (ref 1.005–1.030)
Urobilinogen, UA: 0.2 mg/dL (ref 0.0–1.0)
pH: 5 (ref 5.0–8.0)

## 2015-07-02 LAB — CBC
HCT: 37.5 % (ref 36.0–46.0)
HCT: 37.9 % (ref 36.0–46.0)
Hemoglobin: 12.2 g/dL (ref 12.0–15.0)
Hemoglobin: 12.5 g/dL (ref 12.0–15.0)
MCH: 28.5 pg (ref 26.0–34.0)
MCH: 29 pg (ref 26.0–34.0)
MCHC: 32.5 g/dL (ref 30.0–36.0)
MCHC: 33 g/dL (ref 30.0–36.0)
MCV: 87.6 fL (ref 78.0–100.0)
MCV: 87.9 fL (ref 78.0–100.0)
PLATELETS: 282 10*3/uL (ref 150–400)
Platelets: 306 10*3/uL (ref 150–400)
RBC: 4.28 MIL/uL (ref 3.87–5.11)
RBC: 4.31 MIL/uL (ref 3.87–5.11)
RDW: 13.7 % (ref 11.5–15.5)
RDW: 13.9 % (ref 11.5–15.5)
WBC: 8.1 10*3/uL (ref 4.0–10.5)
WBC: 9.2 10*3/uL (ref 4.0–10.5)

## 2015-07-02 LAB — COMPREHENSIVE METABOLIC PANEL
ALBUMIN: 3.7 g/dL (ref 3.5–5.0)
ALK PHOS: 67 U/L (ref 38–126)
ALT: 16 U/L (ref 14–54)
ALT: 18 U/L (ref 14–54)
ANION GAP: 8 (ref 5–15)
AST: 23 U/L (ref 15–41)
AST: 24 U/L (ref 15–41)
Albumin: 3.5 g/dL (ref 3.5–5.0)
Alkaline Phosphatase: 63 U/L (ref 38–126)
Anion gap: 8 (ref 5–15)
BILIRUBIN TOTAL: 0.3 mg/dL (ref 0.3–1.2)
BUN: 16 mg/dL (ref 6–20)
BUN: 18 mg/dL (ref 6–20)
CHLORIDE: 101 mmol/L (ref 101–111)
CO2: 29 mmol/L (ref 22–32)
CO2: 29 mmol/L (ref 22–32)
Calcium: 9.5 mg/dL (ref 8.9–10.3)
Calcium: 9.7 mg/dL (ref 8.9–10.3)
Chloride: 99 mmol/L — ABNORMAL LOW (ref 101–111)
Creatinine, Ser: 1.33 mg/dL — ABNORMAL HIGH (ref 0.44–1.00)
Creatinine, Ser: 1.62 mg/dL — ABNORMAL HIGH (ref 0.44–1.00)
GFR calc Af Amer: 44 mL/min — ABNORMAL LOW (ref >60)
GFR calc Af Amer: 56 mL/min — ABNORMAL LOW (ref >60)
GFR, EST NON AFRICAN AMERICAN: 38 mL/min — AB (ref >60)
GFR, EST NON AFRICAN AMERICAN: 49 mL/min — AB (ref >60)
Glucose, Bld: 115 mg/dL — ABNORMAL HIGH (ref 65–99)
Glucose, Bld: 117 mg/dL — ABNORMAL HIGH (ref 65–99)
POTASSIUM: 4.1 mmol/L (ref 3.5–5.1)
Potassium: 3.9 mmol/L (ref 3.5–5.1)
Sodium: 136 mmol/L (ref 135–145)
Sodium: 138 mmol/L (ref 135–145)
TOTAL PROTEIN: 7.1 g/dL (ref 6.5–8.1)
TOTAL PROTEIN: 7.4 g/dL (ref 6.5–8.1)
Total Bilirubin: 0.2 mg/dL — ABNORMAL LOW (ref 0.3–1.2)

## 2015-07-02 LAB — URINE MICROSCOPIC-ADD ON

## 2015-07-02 LAB — SURGICAL PCR SCREEN
MRSA, PCR: NEGATIVE
STAPHYLOCOCCUS AUREUS: NEGATIVE

## 2015-07-02 LAB — LIPASE, BLOOD: LIPASE: 31 U/L (ref 22–51)

## 2015-07-02 SURGERY — LAPAROSCOPIC CHOLECYSTECTOMY
Anesthesia: General | Site: Abdomen

## 2015-07-02 MED ORDER — ONDANSETRON HCL 4 MG/2ML IJ SOLN
INTRAMUSCULAR | Status: AC
  Filled 2015-07-02: qty 2

## 2015-07-02 MED ORDER — SUCCINYLCHOLINE CHLORIDE 20 MG/ML IJ SOLN
INTRAMUSCULAR | Status: DC | PRN
  Administered 2015-07-02: 200 mg via INTRAVENOUS

## 2015-07-02 MED ORDER — HYDROMORPHONE HCL 1 MG/ML IJ SOLN
0.2500 mg | INTRAMUSCULAR | Status: DC | PRN
  Administered 2015-07-02 (×2): 0.5 mg via INTRAVENOUS

## 2015-07-02 MED ORDER — HYDRALAZINE HCL 20 MG/ML IJ SOLN
INTRAMUSCULAR | Status: AC
  Filled 2015-07-02: qty 1

## 2015-07-02 MED ORDER — LACTATED RINGERS IV SOLN
INTRAVENOUS | Status: DC | PRN
  Administered 2015-07-02: 13:00:00 via INTRAVENOUS

## 2015-07-02 MED ORDER — ONDANSETRON HCL 4 MG/2ML IJ SOLN
4.0000 mg | Freq: Four times a day (QID) | INTRAMUSCULAR | Status: DC | PRN
  Administered 2015-07-02 – 2015-07-03 (×2): 4 mg via INTRAVENOUS
  Filled 2015-07-02 (×2): qty 2

## 2015-07-02 MED ORDER — ONDANSETRON HCL 4 MG/2ML IJ SOLN
4.0000 mg | Freq: Once | INTRAMUSCULAR | Status: AC
  Administered 2015-07-02: 4 mg via INTRAVENOUS
  Filled 2015-07-02: qty 2

## 2015-07-02 MED ORDER — MIDAZOLAM HCL 2 MG/2ML IJ SOLN
INTRAMUSCULAR | Status: AC
  Filled 2015-07-02: qty 2

## 2015-07-02 MED ORDER — SODIUM CHLORIDE 0.9 % IR SOLN
Status: DC | PRN
Start: 2015-07-02 — End: 2015-07-02
  Administered 2015-07-02: 1000 mL

## 2015-07-02 MED ORDER — ONDANSETRON HCL 4 MG/2ML IJ SOLN: 4.0000 mg | Freq: Once | INTRAMUSCULAR | Status: DC

## 2015-07-02 MED ORDER — NEOSTIGMINE METHYLSULFATE 10 MG/10ML IV SOLN
INTRAVENOUS | Status: AC
  Filled 2015-07-02: qty 1

## 2015-07-02 MED ORDER — PROPOFOL 10 MG/ML IV BOLUS
INTRAVENOUS | Status: DC | PRN
  Administered 2015-07-02: 50 mg via INTRAVENOUS
  Administered 2015-07-02: 200 mg via INTRAVENOUS
  Administered 2015-07-02: 20 mg via INTRAVENOUS

## 2015-07-02 MED ORDER — CEFAZOLIN SODIUM-DEXTROSE 2-3 GM-% IV SOLR
INTRAVENOUS | Status: AC
  Filled 2015-07-02: qty 100

## 2015-07-02 MED ORDER — FENTANYL CITRATE (PF) 250 MCG/5ML IJ SOLN
INTRAMUSCULAR | Status: AC
  Filled 2015-07-02: qty 5

## 2015-07-02 MED ORDER — GLYCOPYRROLATE 0.2 MG/ML IJ SOLN
INTRAMUSCULAR | Status: DC | PRN
  Administered 2015-07-02: 0.6 mg via INTRAVENOUS

## 2015-07-02 MED ORDER — ENOXAPARIN SODIUM 40 MG/0.4ML ~~LOC~~ SOLN
40.0000 mg | Freq: Every morning | SUBCUTANEOUS | Status: DC
  Administered 2015-07-03: 40 mg via SUBCUTANEOUS
  Filled 2015-07-02 (×2): qty 0.4

## 2015-07-02 MED ORDER — GLYCOPYRROLATE 0.2 MG/ML IJ SOLN
INTRAMUSCULAR | Status: AC
Start: 2015-07-02 — End: 2015-07-02
  Filled 2015-07-02: qty 3

## 2015-07-02 MED ORDER — ROCURONIUM BROMIDE 100 MG/10ML IV SOLN
INTRAVENOUS | Status: DC | PRN
  Administered 2015-07-02: 20 mg via INTRAVENOUS
  Administered 2015-07-02: 10 mg via INTRAVENOUS

## 2015-07-02 MED ORDER — CIPROFLOXACIN IN D5W 400 MG/200ML IV SOLN
400.0000 mg | Freq: Two times a day (BID) | INTRAVENOUS | Status: DC
  Administered 2015-07-02 – 2015-07-03 (×3): 400 mg via INTRAVENOUS
  Filled 2015-07-02 (×5): qty 200

## 2015-07-02 MED ORDER — PROMETHAZINE HCL 25 MG/ML IJ SOLN: 6.2500 mg | INTRAMUSCULAR | Status: DC | PRN

## 2015-07-02 MED ORDER — HYDROMORPHONE HCL 1 MG/ML IJ SOLN
INTRAMUSCULAR | Status: AC
  Filled 2015-07-02: qty 1

## 2015-07-02 MED ORDER — ALBUTEROL SULFATE HFA 108 (90 BASE) MCG/ACT IN AERS
INHALATION_SPRAY | RESPIRATORY_TRACT | Status: AC
  Filled 2015-07-02: qty 6.7

## 2015-07-02 MED ORDER — DEXAMETHASONE SODIUM PHOSPHATE 10 MG/ML IJ SOLN
INTRAMUSCULAR | Status: AC
  Filled 2015-07-02: qty 1

## 2015-07-02 MED ORDER — NEOSTIGMINE METHYLSULFATE 10 MG/10ML IV SOLN
INTRAVENOUS | Status: DC | PRN
  Administered 2015-07-02: 4 mg via INTRAVENOUS

## 2015-07-02 MED ORDER — PHENYLEPHRINE HCL 10 MG/ML IJ SOLN
INTRAMUSCULAR | Status: DC | PRN
  Administered 2015-07-02: 40 ug via INTRAVENOUS

## 2015-07-02 MED ORDER — DEXTROSE-NACL 5-0.9 % IV SOLN
INTRAVENOUS | Status: DC
Start: 2015-07-02 — End: 2015-07-02
  Administered 2015-07-02: 06:00:00 via INTRAVENOUS

## 2015-07-02 MED ORDER — MORPHINE SULFATE 4 MG/ML IJ SOLN
4.0000 mg | Freq: Once | INTRAMUSCULAR | Status: AC
  Administered 2015-07-02: 4 mg via INTRAVENOUS
  Filled 2015-07-02: qty 1

## 2015-07-02 MED ORDER — EPHEDRINE SULFATE 50 MG/ML IJ SOLN
INTRAMUSCULAR | Status: DC | PRN
  Administered 2015-07-02 (×4): 10 mg via INTRAVENOUS

## 2015-07-02 MED ORDER — ROCURONIUM BROMIDE 50 MG/5ML IV SOLN
INTRAVENOUS | Status: AC
Start: 2015-07-02 — End: 2015-07-02
  Filled 2015-07-02: qty 1

## 2015-07-02 MED ORDER — HYDROMORPHONE HCL 1 MG/ML IJ SOLN: 1.0000 mg | INTRAMUSCULAR | Status: DC | PRN

## 2015-07-02 MED ORDER — ONDANSETRON HCL 4 MG/2ML IJ SOLN
INTRAMUSCULAR | Status: DC | PRN
  Administered 2015-07-02: 4 mg via INTRAVENOUS

## 2015-07-02 MED ORDER — BUPIVACAINE-EPINEPHRINE 0.25% -1:200000 IJ SOLN
INTRAMUSCULAR | Status: DC | PRN
  Administered 2015-07-02: 30 mL

## 2015-07-02 MED ORDER — GLYCOPYRROLATE 0.2 MG/ML IJ SOLN
INTRAMUSCULAR | Status: AC
Start: 2015-07-02 — End: 2015-07-02
  Filled 2015-07-02: qty 10

## 2015-07-02 MED ORDER — DEXTROSE-NACL 5-0.9 % IV SOLN: INTRAVENOUS | Status: DC

## 2015-07-02 MED ORDER — MIDAZOLAM HCL 5 MG/5ML IJ SOLN
INTRAMUSCULAR | Status: DC | PRN
  Administered 2015-07-02: 2 mg via INTRAVENOUS

## 2015-07-02 MED ORDER — SCOPOLAMINE 1 MG/3DAYS TD PT72
MEDICATED_PATCH | TRANSDERMAL | Status: AC
  Filled 2015-07-02: qty 1

## 2015-07-02 MED ORDER — SODIUM CHLORIDE 0.9 % IV BOLUS (SEPSIS)
1000.0000 mL | Freq: Once | INTRAVENOUS | Status: AC
  Administered 2015-07-02: 1000 mL via INTRAVENOUS

## 2015-07-02 MED ORDER — ESMOLOL HCL 10 MG/ML IV SOLN
INTRAVENOUS | Status: AC
  Filled 2015-07-02: qty 10

## 2015-07-02 MED ORDER — LABETALOL HCL 5 MG/ML IV SOLN
INTRAVENOUS | Status: AC
  Filled 2015-07-02: qty 4

## 2015-07-02 MED ORDER — KETOROLAC TROMETHAMINE 30 MG/ML IJ SOLN
INTRAMUSCULAR | Status: DC | PRN
  Administered 2015-07-02: 30 mg via INTRAVENOUS

## 2015-07-02 MED ORDER — MEPERIDINE HCL 25 MG/ML IJ SOLN: 6.2500 mg | INTRAMUSCULAR | Status: DC | PRN

## 2015-07-02 MED ORDER — KETOROLAC TROMETHAMINE 30 MG/ML IJ SOLN
INTRAMUSCULAR | Status: AC
  Filled 2015-07-02: qty 1

## 2015-07-02 MED ORDER — FENTANYL CITRATE (PF) 100 MCG/2ML IJ SOLN
INTRAMUSCULAR | Status: DC | PRN
  Administered 2015-07-02: 50 ug via INTRAVENOUS
  Administered 2015-07-02: 100 ug via INTRAVENOUS
  Administered 2015-07-02: 50 ug via INTRAVENOUS

## 2015-07-02 MED ORDER — OXYCODONE-ACETAMINOPHEN 5-325 MG PO TABS: 1.0000 | ORAL_TABLET | ORAL | Status: DC | PRN

## 2015-07-02 MED ORDER — HYDROMORPHONE HCL 1 MG/ML IJ SOLN
1.0000 mg | INTRAMUSCULAR | Status: DC | PRN
  Administered 2015-07-02 – 2015-07-03 (×2): 1 mg via INTRAVENOUS
  Filled 2015-07-02 (×2): qty 1

## 2015-07-02 MED ORDER — 0.9 % SODIUM CHLORIDE (POUR BTL) OPTIME
TOPICAL | Status: DC | PRN
  Administered 2015-07-02: 1000 mL

## 2015-07-02 MED ORDER — ATENOLOL 50 MG PO TABS
50.0000 mg | ORAL_TABLET | Freq: Every day | ORAL | Status: DC
  Administered 2015-07-02 – 2015-07-03 (×2): 50 mg via ORAL
  Filled 2015-07-02 (×3): qty 1

## 2015-07-02 SURGICAL SUPPLY — 38 items
APPLIER CLIP 5 13 M/L LIGAMAX5 (MISCELLANEOUS) ×3
APR CLP MED LRG 5 ANG JAW (MISCELLANEOUS) ×1
BAG SPEC RTRVL LRG 6X4 10 (ENDOMECHANICALS) ×2
CANISTER SUCTION 2500CC (MISCELLANEOUS) ×3 IMPLANT
CHLORAPREP W/TINT 26ML (MISCELLANEOUS) ×3 IMPLANT
CLIP APPLIE 5 13 M/L LIGAMAX5 (MISCELLANEOUS) ×1 IMPLANT
COVER SURGICAL LIGHT HANDLE (MISCELLANEOUS) ×3 IMPLANT
ELECT REM PT RETURN 9FT ADLT (ELECTROSURGICAL) ×3
ELECTRODE REM PT RTRN 9FT ADLT (ELECTROSURGICAL) ×1 IMPLANT
GLOVE BIO SURGEON STRL SZ 6.5 (GLOVE) ×1 IMPLANT
GLOVE BIO SURGEON STRL SZ7 (GLOVE) ×2 IMPLANT
GLOVE BIO SURGEONS STRL SZ 6.5 (GLOVE) ×1
GLOVE BIOGEL PI IND STRL 6.5 (GLOVE) IMPLANT
GLOVE BIOGEL PI INDICATOR 6.5 (GLOVE) ×4
GLOVE SURG SIGNA 7.5 PF LTX (GLOVE) ×3 IMPLANT
GOWN STRL REUS W/ TWL LRG LVL3 (GOWN DISPOSABLE) ×2 IMPLANT
GOWN STRL REUS W/ TWL XL LVL3 (GOWN DISPOSABLE) ×1 IMPLANT
GOWN STRL REUS W/TWL LRG LVL3 (GOWN DISPOSABLE) ×9
GOWN STRL REUS W/TWL XL LVL3 (GOWN DISPOSABLE) ×3
KIT BASIN OR (CUSTOM PROCEDURE TRAY) ×3 IMPLANT
KIT ROOM TURNOVER OR (KITS) ×3 IMPLANT
LIQUID BAND (GAUZE/BANDAGES/DRESSINGS) ×3 IMPLANT
NS IRRIG 1000ML POUR BTL (IV SOLUTION) ×3 IMPLANT
PAD ARMBOARD 7.5X6 YLW CONV (MISCELLANEOUS) ×3 IMPLANT
POUCH SPECIMEN RETRIEVAL 10MM (ENDOMECHANICALS) ×5 IMPLANT
SCISSORS LAP 5X35 DISP (ENDOMECHANICALS) ×3 IMPLANT
SET IRRIG TUBING LAPAROSCOPIC (IRRIGATION / IRRIGATOR) ×3 IMPLANT
SLEEVE ENDOPATH XCEL 5M (ENDOMECHANICALS) ×6 IMPLANT
SPECIMEN JAR SMALL (MISCELLANEOUS) ×3 IMPLANT
SUT MON AB 4-0 PC3 18 (SUTURE) ×3 IMPLANT
TOWEL OR 17X24 6PK STRL BLUE (TOWEL DISPOSABLE) ×3 IMPLANT
TOWEL OR 17X26 10 PK STRL BLUE (TOWEL DISPOSABLE) ×3 IMPLANT
TRAY LAPAROSCOPIC MC (CUSTOM PROCEDURE TRAY) ×3 IMPLANT
TROCAR 12M 150ML BLUNT (TROCAR) ×2 IMPLANT
TROCAR 5M 150ML BLDLS (TROCAR) IMPLANT
TROCAR XCEL BLUNT TIP 100MML (ENDOMECHANICALS) ×3 IMPLANT
TROCAR XCEL NON-BLD 5MMX100MML (ENDOMECHANICALS) ×3 IMPLANT
TUBING INSUFFLATION (TUBING) ×3 IMPLANT

## 2015-07-02 NOTE — ED Notes (Signed)
Patient transported to Ultrasound 

## 2015-07-02 NOTE — ED Notes (Signed)
MD at bedside. 

## 2015-07-02 NOTE — Anesthesia Preprocedure Evaluation (Signed)
Anesthesia Evaluation  Patient identified by MRN, date of birth, ID band Patient awake    Reviewed: Allergy & Precautions, NPO status , Patient's Chart, lab work & pertinent test results, reviewed documented beta blocker date and time   Airway Mallampati: III  TM Distance: >3 FB Neck ROM: Full    Dental no notable dental hx.    Pulmonary neg pulmonary ROS,  breath sounds clear to auscultation  Pulmonary exam normal       Cardiovascular hypertension, Pt. on home beta blockers Normal cardiovascular examRhythm:Regular Rate:Normal     Neuro/Psych negative neurological ROS  negative psych ROS   GI/Hepatic negative GI ROS, Neg liver ROS,   Endo/Other  Morbid obesity  Renal/GU negative Renal ROS     Musculoskeletal negative musculoskeletal ROS (+)   Abdominal (+) + obese,   Peds  Hematology negative hematology ROS (+)   Anesthesia Other Findings   Reproductive/Obstetrics negative OB ROS                             Anesthesia Physical Anesthesia Plan  ASA: III  Anesthesia Plan: General   Post-op Pain Management:    Induction: Intravenous  Airway Management Planned: Oral ETT  Additional Equipment:   Intra-op Plan:   Post-operative Plan: Extubation in OR  Informed Consent: I have reviewed the patients History and Physical, chart, labs and discussed the procedure including the risks, benefits and alternatives for the proposed anesthesia with the patient or authorized representative who has indicated his/her understanding and acceptance.   Dental advisory given  Plan Discussed with: CRNA  Anesthesia Plan Comments:         Anesthesia Quick Evaluation

## 2015-07-02 NOTE — ED Provider Notes (Signed)
CSN: [id number]     Arrival date & time 07/01/15  2336 History   This chart was scribed for Shon Baton, MD by Arlan Organ, ED Scribe. This patient was seen in room A04C/A04C and the patient's care was started 12:46 AM.   Chief Complaint  Patient presents with  . Abdominal Pain   The history is provided by the patient. No language interpreter was used.    HPI Comments: Allison Mays is a 44 y.o. female with a PMHx of HTN who presents to the Emergency Department complaining of constant, ongoing abdominal pain onset 7 PM this evening. Currently pain is rated 10/10 and states symptoms worsened after eating dinner this evening. No alleviating factors a this time. She also reports nausea and vomiting since onset of abdominal pain. No OTC medications or home remedies attempted prior to arrival. No recent fever or chills. Allison Mays admits to a history of same and was told symptoms were related to gallstones. No know allergies to medications.  Past Medical History  Diagnosis Date  . Hypertension   . Obesity    Past Surgical History  Procedure Laterality Date  . Abdominal hysterectomy     Family History  Problem Relation Age of Onset  . Anuerysm Father    History  Substance Use Topics  . Smoking status: Never Smoker   . Smokeless tobacco: Not on file  . Alcohol Use: Yes   OB History    Gravida Para Term Preterm AB TAB SAB Ectopic Multiple Living   0   0  1     Review of Systems  Constitutional: Negative for fever and chills.  Respiratory: Negative for shortness of breath.   Cardiovascular: Negative for chest pain.  Gastrointestinal: Positive for nausea, vomiting, abdominal pain and diarrhea.  Neurological: Negative for dizziness and light-headedness.  Psychiatric/Behavioral: Negative for confusion.  All other systems reviewed and are negative.     Allergies  Review of patient's allergies indicates no known allergies.  Home Medications   Prior to  Admission medications   Medication Sig Start Date End Date Taking? Authorizing Provider  atenolol (TENORMIN) 50 MG tablet Take 50 mg by mouth daily.   Yes Historical Provider, MD  hydrochlorothiazide (HYDRODIURIL) 25 MG tablet Take 25 mg by mouth daily.   Yes Historical Provider, MD  ibuprofen (ADVIL,MOTRIN) 200 MG tablet Take 200-400 mg by mouth every 6 (six) hours as needed for mild pain or moderate pain.    Yes Historical Provider, MD  PRESCRIPTION MEDICATION Take 1 tablet by mouth daily. Type of fluid pill   Yes Historical Provider, MD   Triage Vitals: BP 155/97 mmHg  Pulse 73  Temp(Src) 97.6 F (36.4 C) (Oral)  Resp 22  Ht 5' 7.5" (1.715 m)  Wt 344 lb (156.037 kg)  BMI 53.05 kg/m2  SpO2 99%   Physical Exam  Constitutional: She is oriented to person, place, and time.  Morbidly obese, uncomfortable appearing  HENT:  Head: Normocephalic and atraumatic.  Cardiovascular: Normal rate, regular rhythm and normal heart sounds.   Pulmonary/Chest: Effort normal. No respiratory distress. She has no wheezes.  Abdominal: Soft. Bowel sounds are normal. There is tenderness. There is guarding. There is no rebound.  Epigastric and right upper quadrant tenderness to palpation  Neurological: She is alert and oriented to person, place, and time.  Skin: Skin is warm and dry.  Psychiatric: She has a normal mood and affect.  Nursing note and vitals reviewed.  ED Course  Procedures (including critical care time)  DIAGNOSTIC STUDIES: Oxygen Saturation is 96% on RA, adequate by my interpretation.    COORDINATION OF CARE: 12:49 AM- Will order Lipase, CMP, CBC, urinalysis, and US Abdomen limited to RUQ. Will give Morphine, fluids, and Zofran. Discussed treatment plan with pt at bedside and pt agreed to plan.     Labs Review Labs Reviewed  COMPREHENSIVE METABOLIC PANEL - Abnormal; Notable for the following:    Chloride 99 (*)    Glucose, Bld 117 (*)    Creatinine, Ser 1.62 (*)    Total  Bilirubin 0.2 (*)    GFR calc non Af Amer 38 (*)    GFR calc Af Amer 44 (*)    All other components within normal limits  URINALYSIS, ROUTINE W REFLEX MICROSCOPIC (NOT AT [facility]) - Abnormal; Notable for the following:    APPearance CLOUDY (*)    Hgb urine dipstick MODERATE (*)    Ketones, ur 15 (*)    All other components within normal limits  URINE MICROSCOPIC-ADD ON - Abnormal; Notable for the following:    Squamous Epithelial / LPF MANY (*)    Bacteria, UA FEW (*)    Casts HYALINE CASTS (*)    All other components within normal limits  LIPASE, BLOOD  CBC    Imaging Review [place] Abdomen Limited Ruq  07/02/2015   CLINICAL DATA:  44 year old female with epigastric pain  EXAM: US ABDOMEN LIMITED - RIGHT UPPER QUADRANT  COMPARISON:  Ultrasound dated 07/1913  FINDINGS: Evaluation is limited due to patient's body habitus.  Gallbladder:  The gallbladder is filled with stones with with sonographic appearance of wall echo shadow. The gallbladder wall measures approximately 5 mm. Tenderness was elicited over the gallbladder. During scanning. No significant pericholecystic fluid identified. However, evaluation is limited due to suboptimal visualization.  Common bile duct:  Diameter: 4 mm  Liver:  Fatty infiltration.  IMPRESSION: Cholelithiasis with apparent thickening of the gallbladder wall and positive sonographic Murphy's sign. Findings may represent acute cholecystitis. A hepatobiliary scintigraphy may provide better evaluation of the gallbladder if an acute cholecystitis is clinically suspected.   Electronically Signed   By: Elgie CollardArash  Radparvar M.D.   On: 07/02/2015 02:25     EKG Interpretation None      MDM   Final diagnoses:  Epigastric pain  Cholecystitis    Patient presents with epigastric abdominal pain. Uncomfortable appearing on exam.  Nontoxic. Tender without signs of peritonitis. Patient was given pain and nausea medication. Right upper quadrant ultrasound ordered to rule out  cholecystitis. Right upper quadrant ultrasound with evidence of gallstones and Murphy's sign as well as gallbladder wall thickening. Somewhat limited by body habitus. Given patient's clinical picture, suspect cholecystitis. Lab work is otherwise reassuring. Discussed with Dr. Luisa Hartornett who will evaluate the patient.   I personally performed the services described in this documentation, which was scribed in my presence. The recorded information has been reviewed and is accurate.   Shon Batonourtney F Jihad Brownlow, MD 07/02/15 [phone]

## 2015-07-02 NOTE — Progress Notes (Signed)
Patient ID: Allison Mays, female   DOB: [birth year], 44 y.o.   MRN: [id number] Plan lap chole today for cholecystitis I discussed the procedure in detail.  We discussed the risks and benefits of a laparoscopic cholecystectomy and possible cholangiogram including, but not limited to bleeding, infection, injury to surrounding structures such as the intestine or liver, bile leak, retained gallstones, need to convert to an open procedure, prolonged diarrhea, blood clots such as  DVT, common bile duct injury, anesthesia risks, and possible need for additional procedures.  The likelihood of improvement in symptoms and return to the patient's normal status is good. We discussed the typical post-operative recovery course.

## 2015-07-02 NOTE — Anesthesia Procedure Notes (Signed)
Procedure Name: Intubation Date/Time: 07/02/2015 2:43 PM Performed by: Rise PatienceBELL, Dorcas Melito T Pre-anesthesia Checklist: Emergency Drugs available, Patient identified, Suction available and Patient being monitored Patient Re-evaluated:Patient Re-evaluated prior to inductionOxygen Delivery Method: Circle system utilized Preoxygenation: Pre-oxygenation with 100% oxygen Intubation Type: IV induction Ventilation: Mask ventilation without difficulty Laryngoscope Size: Mac and 4 Grade View: Grade I Tube type: Oral Tube size: 7.0 mm Number of attempts: 1 Airway Equipment and Method: Stylet Placement Confirmation: ETT inserted through vocal cords under direct vision,  positive ETCO2 and breath sounds checked- equal and bilateral Secured at: 22 cm Tube secured with: Tape Dental Injury: Teeth and Oropharynx as per pre-operative assessment  Comments: Intubation by Flowers, CRNA.

## 2015-07-02 NOTE — Transfer of Care (Signed)
Immediate Anesthesia Transfer of Care Note  Patient: Allison Mays  Procedure(s) Performed: Procedure(s): LAPAROSCOPIC CHOLECYSTECTOMY (N/A)  Patient Location: PACU  Anesthesia Type:General  Level of Consciousness: awake, alert  and oriented  Airway & Oxygen Therapy: Patient Spontanous Breathing and Patient connected to face mask oxygen  Post-op Assessment: Report given to RN, Post -op Vital signs reviewed and stable and Patient moving all extremities X 4  Post vital signs: Reviewed and stable  Last Vitals:  Filed Vitals:   07/02/15 1300  BP: 145/72  Pulse: 70  Temp: 36.8 C  Resp: 20    Complications: No apparent anesthesia complications

## 2015-07-02 NOTE — H&P (Signed)
Allison Mays is an 44 y.o. female.   Chief Complaint: EPIGASTRIC ABDOMINAL PAIN  HPI: pt presents with 1 day complaint of epigastric abdominal pain nausea and vomiting .  Has hx of gallstones.  Pain started yesterday location epigastrium sharp and constant.  Pain meds help some but returns when they wear off. U/S shows gallstones and GB wall thickening.  Seen in 2014 for same problem.  Elected not to have surgery at that time.   Past Medical History  Diagnosis Date  . Hypertension   . Obesity     Past Surgical History  Procedure Laterality Date  . Abdominal hysterectomy      Family History  Problem Relation Age of Onset  . Anuerysm Father    Social History:  reports that she has never smoked. She does not have any smokeless tobacco history on file. She reports that she drinks alcohol. She reports that she does not use illicit drugs.  Allergies: No Known Allergies   (Not in a hospital admission)  Results for orders placed or performed during the hospital encounter of 07/02/15 (from the past 48 hour(s))  Urinalysis, Routine w reflex microscopic (not at [facility])     Status: Abnormal   Collection Time: 07/01/15 11:45 PM  Result Value Ref Range   Color, Urine YELLOW YELLOW   APPearance CLOUDY (A) CLEAR   Specific Gravity, Urine 1.027 1.005 - 1.030   pH 5.0 5.0 - 8.0   Glucose, UA NEGATIVE NEGATIVE mg/dL   Hgb urine dipstick MODERATE (A) NEGATIVE   Bilirubin Urine NEGATIVE NEGATIVE   Ketones, ur 15 (A) NEGATIVE mg/dL   Protein, ur NEGATIVE NEGATIVE mg/dL   Urobilinogen, UA 0.2 0.0 - 1.0 mg/dL   Nitrite NEGATIVE NEGATIVE   Leukocytes, UA NEGATIVE NEGATIVE  Urine microscopic-add on     Status: Abnormal   Collection Time: 07/01/15 11:45 PM  Result Value Ref Range   Squamous Epithelial / LPF MANY (A) RARE   RBC / HPF 3-6 <3 RBC/hpf   Bacteria, UA FEW (A) RARE   Casts HYALINE CASTS (A) NEGATIVE  Lipase, blood     Status: None   Collection Time: 07/01/15 11:50 PM  Result Value  Ref Range   Lipase 31 22 - 51 U/L  Comprehensive metabolic panel     Status: Abnormal   Collection Time: 07/01/15 11:50 PM  Result Value Ref Range   Sodium 136 135 - 145 mmol/L   Potassium 3.9 3.5 - 5.1 mmol/L   Chloride 99 (L) 101 - 111 mmol/L   CO2 29 22 - 32 mmol/L   Glucose, Bld 117 (H) 65 - 99 mg/dL   BUN 18 6 - 20 mg/dL   Creatinine, Ser 1.62 (H) 0.44 - 1.00 mg/dL   Calcium 9.7 8.9 - 10.3 mg/dL   Total Protein 7.4 6.5 - 8.1 g/dL   Albumin 3.7 3.5 - 5.0 g/dL   AST 24 15 - 41 U/L   ALT 18 14 - 54 U/L   Alkaline Phosphatase 67 38 - 126 U/L   Total Bilirubin 0.2 (L) 0.3 - 1.2 mg/dL   GFR calc non Af Amer 38 (L) >60 mL/min   GFR calc Af Amer 44 (L) >60 mL/min    Comment: (NOTE) The eGFR has been calculated using the CKD EPI equation. This calculation has not been validated in all clinical situations. eGFR's persistently <60 mL/min signify possible Chronic Kidney Disease.    Anion gap 8 5 - 15  CBC  Status: None   Collection Time: 07/01/15 11:50 PM  Result Value Ref Range   WBC 9.2 4.0 - 10.5 K/uL   RBC 4.28 3.87 - 5.11 MIL/uL   Hemoglobin 12.2 12.0 - 15.0 g/dL   HCT 37.5 36.0 - 46.0 %   MCV 87.6 78.0 - 100.0 fL   MCH 28.5 26.0 - 34.0 pg   MCHC 32.5 30.0 - 36.0 g/dL   RDW 13.9 11.5 - 15.5 %   Platelets 306 150 - 400 K/uL   Us Abdomen Limited Ruq  07/02/2015   CLINICAL DATA:  44-year-old female with epigastric pain  EXAM: US ABDOMEN LIMITED - RIGHT UPPER QUADRANT  COMPARISON:  Ultrasound dated 07/1913  FINDINGS: Evaluation is limited due to patient's body habitus.  Gallbladder:  The gallbladder is filled with stones with with sonographic appearance of wall echo shadow. The gallbladder wall measures approximately 5 mm. Tenderness was elicited over the gallbladder. During scanning. No significant pericholecystic fluid identified. However, evaluation is limited due to suboptimal visualization.  Common bile duct:  Diameter: 4 mm  Liver:  Fatty infiltration.  IMPRESSION:  Cholelithiasis with apparent thickening of the gallbladder wall and positive sonographic Murphy's sign. Findings may represent acute cholecystitis. A hepatobiliary scintigraphy may provide better evaluation of the gallbladder if an acute cholecystitis is clinically suspected.   Electronically Signed   By: Arash  Radparvar M.D.   On: 07/02/2015 02:25    Review of Systems  Constitutional: Positive for malaise/fatigue.  HENT: Negative.   Eyes: Negative.   Respiratory: Negative.   Cardiovascular: Negative for chest pain.  Gastrointestinal: Positive for abdominal pain.  Genitourinary: Negative for dysuria and hematuria.  Musculoskeletal: Negative.   Neurological: Negative.   Psychiatric/Behavioral: Negative.     Blood pressure 155/97, pulse 73, temperature 97.6 F (36.4 C), temperature source Oral, resp. rate 22, height 5' 7.5" (1.715 m), weight 156.037 kg (344 lb), SpO2 99 %. Physical Exam  Constitutional: She is oriented to person, place, and time. She appears well-developed. She appears distressed.  HENT:  Head: Normocephalic and atraumatic.  Eyes: Pupils are equal, round, and reactive to light. No scleral icterus.  Neck: Normal range of motion. Neck supple.  Cardiovascular: Normal rate and regular rhythm.   Respiratory: Effort normal and breath sounds normal.  GI: There is tenderness. There is positive Murphy's sign.  obese  Musculoskeletal: Normal range of motion.  Neurological: She is alert and oriented to person, place, and time.  Skin: Skin is warm and dry.  Psychiatric: She has a normal mood and affect. Her behavior is normal. Judgment and thought content normal.     Assessment/Plan Acute cholecystitis  Elevated Cr        IVF  And monitor    Admit for laparoscopic cholecystectomy today per Dr Blackman  IVF/ABX/NPO   CORNETT,THOMAS A. 07/02/2015, 3:24 AM    

## 2015-07-02 NOTE — Op Note (Signed)

## 2015-07-02 NOTE — Progress Notes (Signed)
Pt prep and sent down to OR via bed for Cholecystectomy. Report given to Lear CorporationJones RN. Vs BP 145/72 HR 70 R 20 O2 Sat 100 % RA  Temp 98.2. Pt denied pain.

## 2015-07-02 NOTE — ED Notes (Signed)
RN attempted to start IV with no success; 2nd RN to start IV

## 2015-07-03 ENCOUNTER — Encounter (HOSPITAL_COMMUNITY): Payer: Self-pay | Admitting: Surgery

## 2015-07-03 DIAGNOSIS — K8020 Calculus of gallbladder without cholecystitis without obstruction: Secondary | ICD-10-CM | POA: Diagnosis present

## 2015-07-03 MED ORDER — OXYCODONE-ACETAMINOPHEN 5-325 MG PO TABS: 1.0000 | ORAL_TABLET | Freq: Four times a day (QID) | ORAL | Status: DC | PRN

## 2015-07-03 NOTE — Anesthesia Postprocedure Evaluation (Signed)
Anesthesia Post Note  Patient: Allison Mays  Procedure(s) Performed: Procedure(s) (LRB): LAPAROSCOPIC CHOLECYSTECTOMY (N/A)  Anesthesia type: General  Patient location: PACU  Post pain: Pain level controlled  Post assessment: Post-op Vital signs reviewed  Last Vitals: BP 126/70 mmHg  Pulse 57  Temp(Src) 37 C (Oral)  Resp 17  Ht 5' 7.5" (1.715 m)  Wt 347 lb 14.2 oz (157.8 kg)  BMI 53.65 kg/m2  SpO2 97%  Post vital signs: Reviewed  Level of consciousness: sedated  Complications: No apparent anesthesia complications

## 2015-07-03 NOTE — Discharge Instructions (Signed)

## 2015-07-03 NOTE — Discharge Summary (Signed)
[facility] Discharge Summary   Patient ID: Allison Mays MRN: [id number] DOB/AGE: 07/18/1971 44 y.o.  Admit date: 07/02/2015 Discharge date: 07/03/2015  Admitting Diagnosis: Cholelithiasis with cholecystitis Severe obesity with BMI 53  Discharge Diagnosis Patient Active Problem List   Diagnosis Date Noted  . Cholelithiasis 07/03/2015  . Severe obesity (BMI >= 40) 07/03/2015  . Acute cholecystitis 07/02/2015    Consultants None  Imaging: US Abdomen Limited Ruq  07/02/2015   CLINICAL DATA:  44 year old female with epigastric pain  EXAM: US ABDOMEN LIMITED - RIGHT UPPER QUADRANT  COMPARISON:  Ultrasound dated 07/1913  FINDINGS: Evaluation is limited due to patient's body habitus.  Gallbladder:  The gallbladder is filled with stones with with sonographic appearance of wall echo shadow. The gallbladder wall measures approximately 5 mm. Tenderness was elicited over the gallbladder. During scanning. No significant pericholecystic fluid identified. However, evaluation is limited due to suboptimal visualization.  Common bile duct:  Diameter: 4 mm  Liver:  Fatty infiltration.  IMPRESSION: Cholelithiasis with apparent thickening of the gallbladder wall and positive sonographic Murphy's sign. Findings may represent acute cholecystitis. A hepatobiliary scintigraphy may provide better evaluation of the gallbladder if an acute cholecystitis is clinically suspected.   Electronically Signed   By: Elgie Collard M.D.   On: 07/02/2015 02:25    Procedures Dr. Magnus Ivan (07/02/15) - Laparoscopic Cholecystectomy   Hospital Course:  44 y/o obese AA female who presented to [facility] with pt presents with 1 day complaint of epigastric abdominal pain nausea and vomiting . Has hx of gallstones. Pain started yesterday location epigastrium sharp and constant. Pain meds help some but returns when they wear off. U/S shows gallstones and GB wall thickening. Seen in 2014 for same problem. Elected  not to have surgery at that time.    Patient was admitted and underwent procedure listed above.  Tolerated procedure well and was transferred to the floor.  Diet was advanced as tolerated.  On POD #1, the patient was voiding well, tolerating diet, ambulating well, pain well controlled, vital signs stable, incisions c/d/i and felt stable for discharge home.  Patient will follow up in our office in 2 weeks and knows to call with questions or concerns.  Physical Exam: General:  Alert, NAD, pleasant, comfortable Abd:  Soft, ND, mild tenderness, incisions C/D/I with dermabond in place     Medication List    TAKE these medications        atenolol 50 MG tablet  Commonly known as:  TENORMIN  Take 50 mg by mouth daily.     hydrochlorothiazide 25 MG tablet  Commonly known as:  HYDRODIURIL  Take 25 mg by mouth daily.     ibuprofen 200 MG tablet  Commonly known as:  ADVIL,MOTRIN  Take 200-400 mg by mouth every 6 (six) hours as needed for mild pain or moderate pain.     oxyCODONE-acetaminophen 5-325 MG per tablet  Commonly known as:  PERCOCET/ROXICET  Take 1-2 tablets by mouth every 6 (six) hours as needed for moderate pain.         Follow-up Information    Follow up with [facility] [place] On 07/23/2015.   Why:  For post-operation check. Your appointment is at 2:15pm, please arrive at least 30 min before your appointment to complete your check in paperwork.  If you are unable to arrive 30 min prior to your appointment time we may have to cancel or reschedule you   Contact information:   [address]  [address] [place] [id number] [phone]      Signed: Nonie HoyerMegan N. Joseangel [place], [facility] [phone]  07/03/2015, 7:56 AM

## 2016-10-28 ENCOUNTER — Ambulatory Visit (INDEPENDENT_AMBULATORY_CARE_PROVIDER_SITE_OTHER): Payer: 59 | Admitting: Family Medicine

## 2016-10-28 ENCOUNTER — Encounter: Payer: Self-pay | Admitting: Family Medicine

## 2016-10-28 VITALS — BP 142/90 | HR 84 | Ht 67.5 in | Wt 387.8 lb

## 2016-10-28 DIAGNOSIS — R7989 Other specified abnormal findings of blood chemistry: Secondary | ICD-10-CM | POA: Insufficient documentation

## 2016-10-28 DIAGNOSIS — Z7689 Persons encountering health services in other specified circumstances: Secondary | ICD-10-CM | POA: Diagnosis not present

## 2016-10-28 DIAGNOSIS — I10 Essential (primary) hypertension: Secondary | ICD-10-CM

## 2016-10-28 MED ORDER — HYDROCHLOROTHIAZIDE 12.5 MG PO CAPS: 12.5000 mg | ORAL_CAPSULE | Freq: Every day | ORAL | 2 refills | Status: DC

## 2016-10-28 NOTE — Progress Notes (Signed)
Subjective:    Patient ID: Allison Mays, female    DOB: 01-Mar-1971, 45 y.o.   MRN: [id number]  HPI Chief Complaint  Patient presents with  . new pt    new pt, needs bp meds. been off med for almost a year   She is new to the practice and here to establish care and for concerns regarding blood pressure. States she has not taken blood pressure medication in almost one year. No reason why she stopped. States she has gained 40 lbs over the past year due to stress eating. Has taken weight loss medication in the past and states it made her BP increase.   Previous medical care: [facility] in [place] for past 2 years.  States she took HCTZ for years. Then she was on lisinopril and it made her loose her voice and she did not like it.  She does not check her BP at home.   Last CPE: 1 1/2 years ago.   Other providers: orthopedist Eulah PontMurphy and Thurston HoleWainer -chronic bilateral knee pain, injections bilaterally.   Past medical history: History of hypertension -was diagnosed in 2012.  Surgeries: hysterectomy and cholecystectomy   Family history: DM in father. Mother -HTN. Sister -allergies.    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 States she has been drinking 2 glasses of wine every other night.   Reviewed allergies, medications, past medical, surgical, family, and social history.    Review of Systems Pertinent positives and negatives in the history of present illness.     Objective:   Physical Exam  Constitutional: She is oriented to person, place, and time. She appears well-developed and well-nourished. No distress.  Cardiovascular: Normal rate, regular rhythm, normal heart sounds and intact distal pulses.  Exam reveals no gallop and no friction rub.   No murmur heard. Pulmonary/Chest: Effort normal and breath sounds normal.  Neurological: She is alert and oriented to person, place, and time.    Skin: Skin is warm and dry. No pallor.  Psychiatric: She has a normal mood and affect. Her behavior is normal. Judgment and thought content normal.   BP (!) 142/90   Pulse 84   Ht 5' 7.5" (1.715 m)   Wt (!) 387 lb 12.8 oz (175.9 kg)   BMI 59.84 kg/m       Assessment & Plan:  Essential hypertension - Plan: hydrochlorothiazide (MICROZIDE) 12.5 MG capsule, CBC with Differential/Platelet, COMPLETE METABOLIC PANEL WITH GFR  Severe obesity (BMI >= 40) (HCC)  Encounter to establish care  Elevated serum creatinine - Plan: COMPLETE METABOLIC PANEL WITH GFR  Discussed that her blood pressure is not within goal range but plan to start her back on low-dose hydrochlorothiazide as opposed to the higher dose that she was on it about one year ago. She does not check her blood sugar at home and plan to have her return in 2 weeks to see how she is doing on the medication. DASH diet provided.  She does have a history of elevated serum creatinine plan to order labs today. Discussed that her BMI places her in morbid obesity category which increases her risk of developing chronic health conditions. She is not interested in weight loss surgery. She reports having adverse effects with phentermine in the past. Discussed that she appears to be a stress eater and recommend healthier habits to control stress. She will follow-up in 2 weeks pending labs. Plan to get her  medical records from previous PCP. She will return for a CPE and fasting labs.

## 2016-10-28 NOTE — Patient Instructions (Signed)
Start taking hydrochlorothiazide 12.5 mg once daily. Return in 2 weeks for follow-up blood pressure check and to see how you're doing on medication. We will call you with lab results.   DASH Eating Plan DASH stands for "Dietary Approaches to Stop Hypertension." The DASH eating plan is a healthy eating plan that has been shown to reduce high blood pressure (hypertension). Additional health benefits may include reducing the risk of type 2 diabetes mellitus, heart disease, and stroke. The DASH eating plan may also help with weight loss. WHAT DO I NEED TO KNOW ABOUT THE DASH EATING PLAN? For the DASH eating plan, you will follow these general guidelines:  Choose foods with a percent daily value for sodium of less than 5% (as listed on the food label).  Use salt-free seasonings or herbs instead of table salt or sea salt.  Check with your health care provider or pharmacist before using salt substitutes.  Eat lower-sodium products, often labeled as "lower sodium" or "no salt added."  Eat fresh foods.  Eat more vegetables, fruits, and low-fat dairy products.  Choose whole grains. Look for the word "whole" as the first word in the ingredient list.  Choose fish and skinless chicken or Mal[place]key more often than red meat. Limit fish, poultry, and meat to 6 oz (170 g) each day.  Limit sweets, desserts, sugars, and sugary drinks.  Choose heart-healthy fats.  Limit cheese to 1 oz (28 g) per day.  Eat more home-cooked food and less restaurant, buffet, and fast food.  Limit fried foods.  Cook foods using methods other than frying.  Limit canned vegetables. If you do use them, rinse them well to decrease the sodium.  When eating at a restaurant, ask that your food be prepared with less salt, or no salt if possible. WHAT FOODS CAN I EAT? Seek help from a dietitian for individual calorie needs. Grains Whole grain or whole wheat bread. Brown rice. Whole grain or whole wheat pasta. Quinoa, bulgur,  and whole grain cereals. Low-sodium cereals. Corn or whole wheat flour tortillas. Whole grain cornbread. Whole grain crackers. Low-sodium crackers. Vegetables Fresh or frozen vegetables (raw, steamed, roasted, or grilled). Low-sodium or reduced-sodium tomato and vegetable juices. Low-sodium or reduced-sodium tomato sauce and paste. Low-sodium or reduced-sodium canned vegetables.  Fruits All fresh, canned (in natural juice), or frozen fruits. Meat and Other Protein Products Ground beef (85% or leaner), grass-fed beef, or beef trimmed of fat. Skinless chicken or [place]key. Ground chicken or [place]urkey. Pork trimmed of fat. All fish and seafood. Eggs. Dried beans, peas, or lentils. Unsalted nuts and seeds. Unsalted canned beans. Dairy Low-fat dairy products, such as skim or 1% milk, 2% or reduced-fat cheeses, low-fat ricotta or cottage cheese, or plain low-fat yogurt. Low-sodium or reduced-sodium cheeses. Fats and Oils Tub margarines without trans fats. Light or reduced-fat mayonnaise and salad dressings (reduced sodium). Avocado. Safflower, olive, or canola oils. Natural peanut or almond butter. Other Unsalted popcorn and pretzels. The items listed above may not be a complete list of recommended foods or beverages. Contact your dietitian for more options. WHAT FOODS ARE NOT RECOMMENDED? Grains White bread. White pasta. White rice. Refined cornbread. Bagels and croissants. Crackers that contain trans fat. Vegetables Creamed or fried vegetables. Vegetables in a cheese sauce. Regular canned vegetables. Regular canned tomato sauce and paste. Regular tomato and vegetable juices. Fruits Dried fruits. Canned fruit in light or heavy syrup. Fruit juice. Meat and Other Protein Products Fatty cuts of meat. Ribs, chicken wings, bacon, sausage, bologna,  salami, chitterlings, fatback, hot dogs, bratwurst, and packaged luncheon meats. Salted nuts and seeds. Canned beans with salt. Dairy Whole or 2% milk, cream,  half-and-half, and cream cheese. Whole-fat or sweetened yogurt. Full-fat cheeses or blue cheese. Nondairy creamers and whipped toppings. Processed cheese, cheese spreads, or cheese curds. Condiments Onion and garlic salt, seasoned salt, table salt, and sea salt. Canned and packaged gravies. Worcestershire sauce. Tartar sauce. Barbecue sauce. Teriyaki sauce. Soy sauce, including reduced sodium. Steak sauce. Fish sauce. Oyster sauce. Cocktail sauce. Horseradish. Ketchup and mustard. Meat flavorings and tenderizers. Bouillon cubes. Hot sauce. Tabasco sauce. Marinades. Taco seasonings. Relishes. Fats and Oils Butter, stick margarine, lard, shortening, ghee, and bacon fat. Coconut, palm kernel, or palm oils. Regular salad dressings. Other Pickles and olives. Salted popcorn and pretzels. The items listed above may not be a complete list of foods and beverages to avoid. Contact your dietitian for more information. WHERE CAN I FIND MORE INFORMATION? National Heart, Lung, and Blood Institute: CablePromo.itwww.nhlbi.nih.gov/health/health-topics/topics/dash/   This information is not intended to replace advice given to you by your health care provider. Make sure you discuss any questions you have with your health care provider.   Document Released: 11/26/2011 Document Revised: 12/28/2014 Document Reviewed: 10/11/2013 Elsevier Interactive Patient Education [employer] Elsevier Inc.

## 2016-10-29 LAB — CBC WITH DIFFERENTIAL/PLATELET
BASOS PCT: 0 %
Basophils Absolute: 0 cells/uL (ref 0–200)
EOS ABS: 288 {cells}/uL (ref 15–500)
EOS PCT: 4 %
HCT: 37.6 % (ref 35.0–45.0)
Hemoglobin: 12.1 g/dL (ref 11.7–15.5)
LYMPHS PCT: 34 %
Lymphs Abs: 2448 cells/uL (ref 850–3900)
MCH: 26.9 pg — AB (ref 27.0–33.0)
MCHC: 32.2 g/dL (ref 32.0–36.0)
MCV: 83.6 fL (ref 80.0–100.0)
MPV: 9.4 fL (ref 7.5–12.5)
Monocytes Absolute: 360 cells/uL (ref 200–950)
Monocytes Relative: 5 %
Neutro Abs: 4104 cells/uL (ref 1500–7800)
Neutrophils Relative %: 57 %
PLATELETS: 332 10*3/uL (ref 140–400)
RBC: 4.5 MIL/uL (ref 3.80–5.10)
RDW: 16 % — AB (ref 11.0–15.0)
WBC: 7.2 10*3/uL (ref 4.0–10.5)

## 2016-10-29 LAB — COMPLETE METABOLIC PANEL WITH GFR
ALT: 22 U/L (ref 6–29)
AST: 28 U/L (ref 10–30)
Albumin: 4 g/dL (ref 3.6–5.1)
Alkaline Phosphatase: 76 U/L (ref 33–115)
BUN: 10 mg/dL (ref 7–25)
CO2: 30 mmol/L (ref 20–31)
Calcium: 9.2 mg/dL (ref 8.6–10.2)
Chloride: 103 mmol/L (ref 98–110)
Creat: 0.82 mg/dL (ref 0.50–1.10)
GFR, EST NON AFRICAN AMERICAN: 88 mL/min (ref >=60)
GFR, Est African American: >89 mL/min (ref >=60)
GLUCOSE: 95 mg/dL (ref 65–99)
Potassium: 4.1 mmol/L (ref 3.5–5.3)
SODIUM: 142 mmol/L (ref 135–146)
Total Bilirubin: 0.2 mg/dL (ref 0.2–1.2)
Total Protein: 7.2 g/dL (ref 6.1–8.1)

## 2016-11-03 ENCOUNTER — Encounter: Payer: Self-pay | Admitting: Family Medicine

## 2016-11-03 ENCOUNTER — Telehealth: Payer: Self-pay

## 2016-11-03 DIAGNOSIS — I10 Essential (primary) hypertension: Secondary | ICD-10-CM | POA: Insufficient documentation

## 2016-11-03 NOTE — Telephone Encounter (Signed)
Records for [facility] (Dr. Juanetta Snowrouch) Gayla DossJoyner placed in your folder for review. Trixie Rude/RLB

## 2016-11-11 ENCOUNTER — Encounter: Payer: Self-pay | Admitting: Family Medicine

## 2016-11-11 ENCOUNTER — Ambulatory Visit (INDEPENDENT_AMBULATORY_CARE_PROVIDER_SITE_OTHER): Payer: 59 | Admitting: Family Medicine

## 2016-11-11 VITALS — BP 130/90 | HR 78 | Wt 383.0 lb

## 2016-11-11 DIAGNOSIS — I10 Essential (primary) hypertension: Secondary | ICD-10-CM

## 2016-11-11 NOTE — Progress Notes (Signed)
   Subjective:    Patient ID: Allison Mays, female    DOB: 01-Feb-1971, 45 y.o.   MRN: [id number]  HPI Chief Complaint  Patient presents with  . follow-up on HTN    follow-up on HTN   She is here for a two-week follow-up on hypertension. This is her second visit to our practice. Prior to 2 weeks ago she had been without blood pressure medication for almost one year. She stopped taking her medication for no particular reason. She stated that she felt like weight loss medication and made her blood pressure increased so she stopped taking the medication.  She has had adverse reaction to lisinopril in the past. Has been on hydrochlorothiazide for years and did well with it. States she has been doing well on the hydrochlorothiazide and no side effects. Reports trying to eat low sodium and eating a healthy diet at home. She has not started exercising.  Denies fever, chills, headache, dizziness, chest pain, palpitations, DOE, abdominal pain, N/V/D or LE edema.    Review of Systems Pertinent positives and negatives in the history of present illness.     Objective:   Physical Exam BP 130/90   Pulse 78   Wt (!) 383 lb (173.7 kg)   BMI 59.10 kg/m  Alert and oriented and in no acute distress. Not otherwise examined.       Assessment & Plan:  Essential hypertension  Congratulated her on 3 lb weight loss over the past 2 weeks. She is eating healthy and has not started exercising. Discussed DASH diet and doing some form of exercise.  Reviewed labs with her from previous visit. Serum creatinine is back to normal. Advised that her BP is not within goal range but it is still early. We will have her check her BP at home and call in 2 weeks to let me know what her readings have been. Discussed that we may need to add a second medication but will give her a couple more weeks to see what the HCTZ can do since she has not taken medication in one year prior to our last visit.  Will have her return  in 1 month.

## 2016-11-11 NOTE — Patient Instructions (Addendum)
Check your blood pressure at least 2 times per week and call me in 2 weeks and let me know what your readings are. I would like to see the BP readings <130/80.  F/U in 1 month.    DASH Eating Plan DASH stands for "Dietary Approaches to Stop Hypertension." The DASH eating plan is a healthy eating plan that has been shown to reduce high blood pressure (hypertension). Additional health benefits may include reducing the risk of type 2 diabetes mellitus, heart disease, and stroke. The DASH eating plan may also help with weight loss. What do I need to know about the DASH eating plan? For the DASH eating plan, you will follow these general guidelines:  Choose foods with less than 150 milligrams of sodium per serving (as listed on the food label).  Use salt-free seasonings or herbs instead of table salt or sea salt.  Check with your health care provider or pharmacist before using salt substitutes.  Eat lower-sodium products. These are often labeled as "low-sodium" or "no salt added."  Eat fresh foods. Avoid eating a lot of canned foods.  Eat more vegetables, fruits, and low-fat dairy products.  Choose whole grains. Look for the word "whole" as the first word in the ingredient list.  Choose fish and skinless chicken or Mal[place]key more often than red meat. Limit fish, poultry, and meat to 6 oz (170 g) each day.  Limit sweets, desserts, sugars, and sugary drinks.  Choose heart-healthy fats.  Eat more home-cooked food and less restaurant, buffet, and fast food.  Limit fried foods.  Do not fry foods. Cook foods using methods such as baking, boiling, grilling, and broiling instead.  When eating at a restaurant, ask that your food be prepared with less salt, or no salt if possible. What foods can I eat? Seek help from a dietitian for individual calorie needs. Grains  Whole grain or whole wheat bread. Brown rice. Whole grain or whole wheat pasta. Quinoa, bulgur, and whole grain cereals. Low-sodium  cereals. Corn or whole wheat flour tortillas. Whole grain cornbread. Whole grain crackers. Low-sodium crackers. Vegetables  Fresh or frozen vegetables (raw, steamed, roasted, or grilled). Low-sodium or reduced-sodium tomato and vegetable juices. Low-sodium or reduced-sodium tomato sauce and paste. Low-sodium or reduced-sodium canned vegetables. Fruits  All fresh, canned (in natural juice), or frozen fruits. Meat and Other Protein Products  Ground beef (85% or leaner), grass-fed beef, or beef trimmed of fat. Skinless chicken or [place]key. Ground chicken or Mal[place]urkey. Pork trimmed of fat. All fish and seafood. Eggs. Dried beans, peas, or lentils. Unsalted nuts and seeds. Unsalted canned beans. Dairy  Low-fat dairy products, such as skim or 1% milk, 2% or reduced-fat cheeses, low-fat ricotta or cottage cheese, or plain low-fat yogurt. Low-sodium or reduced-sodium cheeses. Fats and Oils  Tub margarines without trans fats. Light or reduced-fat mayonnaise and salad dressings (reduced sodium). Avocado. Safflower, olive, or canola oils. Natural peanut or almond butter. Other  Unsalted popcorn and pretzels. The items listed above may not be a complete list of recommended foods or beverages. Contact your dietitian for more options.  What foods are not recommended? Grains  White bread. White pasta. White rice. Refined cornbread. Bagels and croissants. Crackers that contain trans fat. Vegetables  Creamed or fried vegetables. Vegetables in a cheese sauce. Regular canned vegetables. Regular canned tomato sauce and paste. Regular tomato and vegetable juices. Fruits  Canned fruit in light or heavy syrup. Fruit juice. Meat and Other Protein Products  Fatty cuts of meat.  Ribs, chicken wings, bacon, sausage, bologna, salami, chitterlings, fatback, hot dogs, bratwurst, and packaged luncheon meats. Salted nuts and seeds. Canned beans with salt. Dairy  Whole or 2% milk, cream, half-and-half, and cream cheese. Whole-fat  or sweetened yogurt. Full-fat cheeses or blue cheese. Nondairy creamers and whipped toppings. Processed cheese, cheese spreads, or cheese curds. Condiments  Onion and garlic salt, seasoned salt, table salt, and sea salt. Canned and packaged gravies. Worcestershire sauce. Tartar sauce. Barbecue sauce. Teriyaki sauce. Soy sauce, including reduced sodium. Steak sauce. Fish sauce. Oyster sauce. Cocktail sauce. Horseradish. Ketchup and mustard. Meat flavorings and tenderizers. Bouillon cubes. Hot sauce. Tabasco sauce. Marinades. Taco seasonings. Relishes. Fats and Oils  Butter, stick margarine, lard, shortening, ghee, and bacon fat. Coconut, palm kernel, or palm oils. Regular salad dressings. Other  Pickles and olives. Salted popcorn and pretzels. The items listed above may not be a complete list of foods and beverages to avoid. Contact your dietitian for more information.  Where can I find more information? National Heart, Lung, and Blood Institute: CablePromo.itwww.nhlbi.nih.gov/health/health-topics/topics/dash/ This information is not intended to replace advice given to you by your health care provider. Make sure you discuss any questions you have with your health care provider. Document Released: 11/26/2011 Document Revised: 05/14/2016 Document Reviewed: 10/11/2013 Elsevier Interactive Patient Education  2017 [employer]

## 2016-12-01 ENCOUNTER — Encounter: Payer: Self-pay | Admitting: Family Medicine

## 2016-12-16 ENCOUNTER — Ambulatory Visit: Payer: 59 | Admitting: Family Medicine

## 2016-12-25 ENCOUNTER — Encounter: Payer: Self-pay | Admitting: Family Medicine

## 2016-12-25 ENCOUNTER — Ambulatory Visit (INDEPENDENT_AMBULATORY_CARE_PROVIDER_SITE_OTHER): Payer: 59 | Admitting: Family Medicine

## 2016-12-25 VITALS — BP 120/68 | HR 79 | Temp 98.4°F | Resp 16 | Wt 386.8 lb

## 2016-12-25 DIAGNOSIS — J209 Acute bronchitis, unspecified: Secondary | ICD-10-CM | POA: Diagnosis not present

## 2016-12-25 MED ORDER — BENZONATATE 200 MG PO CAPS: 200.0000 mg | ORAL_CAPSULE | Freq: Two times a day (BID) | ORAL | 0 refills | Status: DC | PRN

## 2016-12-25 MED ORDER — AZITHROMYCIN 250 MG PO TABS: ORAL_TABLET | ORAL | 0 refills | Status: DC

## 2016-12-25 MED ORDER — ALBUTEROL SULFATE (2.5 MG/3ML) 0.083% IN NEBU
2.5000 mg | INHALATION_SOLUTION | Freq: Once | RESPIRATORY_TRACT | Status: AC
  Administered 2016-12-25: 2.5 mg via RESPIRATORY_TRACT

## 2016-12-25 NOTE — Patient Instructions (Signed)
Cough, Adult Coughing is a reflex that clears your throat and your airways. Coughing helps to heal and protect your lungs. It is normal to cough occasionally, but a cough that happens with other symptoms or lasts a long time may be a sign of a condition that needs treatment. A cough may last only 2-3 weeks (acute), or it may last longer than 8 weeks (chronic). What are the causes? Coughing is commonly caused by:  Breathing in substances that irritate your lungs.  A viral or bacterial respiratory infection.  Allergies.  Asthma.  Postnasal drip.  Smoking.  Acid backing up from the stomach into the esophagus (gastroesophageal reflux).  Certain medicines.  Chronic lung problems, including COPD (or rarely, lung cancer).  Other medical conditions such as heart failure.  Follow these instructions at home: Pay attention to any changes in your symptoms. Take these actions to help with your discomfort:  Take medicines only as told by your health care provider. ? If you were prescribed an antibiotic medicine, take it as told by your health care provider. Do not stop taking the antibiotic even if you start to feel better. ? Talk with your health care provider before you take a cough suppressant medicine.  Drink enough fluid to keep your urine clear or pale yellow.  If the air is dry, use a cold steam vaporizer or humidifier in your bedroom or your home to help loosen secretions.  Avoid anything that causes you to cough at work or at home.  If your cough is worse at night, try sleeping in a semi-upright position.  Avoid cigarette smoke. If you smoke, quit smoking. If you need help quitting, ask your health care provider.  Avoid caffeine.  Avoid alcohol.  Rest as needed.  Contact a health care provider if:  You have new symptoms.  You cough up pus.  Your cough does not get better after 2-3 weeks, or your cough gets worse.  You cannot control your cough with suppressant  medicines and you are losing sleep.  You develop pain that is getting worse or pain that is not controlled with pain medicines.  You have a fever.  You have unexplained weight loss.  You have night sweats. Get help right away if:  You cough up blood.  You have difficulty breathing.  Your heartbeat is very fast. This information is not intended to replace advice given to you by your health care provider. Make sure you discuss any questions you have with your health care provider. Document Released: 06/05/2011 Document Revised: 05/14/2016 Document Reviewed: 02/13/2015 Elsevier Interactive Patient Education  2017 Elsevier Inc.  

## 2016-12-25 NOTE — Progress Notes (Signed)
Subjective: Chief Complaint  Patient presents with  . cough    cough - 2 weeks and getting worse.      Allison Mays is a 46 y.o. female who presents for possible bronchitis.  Symptoms include a 2 week history of cough that seems to be getting worse. Cough is worse in the evening and has a hard time catching her breath at times. Also reports scratchy throat and ear pain that started yesterday.   Denies fever, chills, chest pain, palpitations, abdominal pain, N/V/D, LE edema.   Treatment to date: cough suppressants and decongestants.  postive sick contacts.   She does not smoke.   She does not have a history of bronchitis.   No other aggravating or relieving factors.  No other c/o. She does not smoke.   The following portions of the patient's history were reviewed and updated as appropriate: allergies, current medications, past family history, past medical history, past social history, past surgical history and problem list.  ROS as in subjective  Past Medical History:  Diagnosis Date  . Hypertension    has taken atenolol in the past per records from [facility]  . Obesity      Objective: Vital signs reviewed BP 120/68   Pulse 79   Temp 98.4 F (36.9 C) (Oral)   Resp 16   Wt (!) 386 lb 12.8 oz (175.5 kg)   SpO2 98%   BMI 59.69 kg/m    General appearance: Alert, WD/WN, no distress, ill appearing                             Skin: warm, no rash, no diaphoresis                           Head: mild frontal sinus tenderness                            Eyes: conjunctiva normal, corneas clear, PERRLA                            Ears: pearly TMs, external ear canals normal                          Nose: septum midline, turbinates swollen, with erythema and clear discharge             Mouth/throat: MMM, tongue normal, mild pharyngeal erythema                           Neck: supple, no adenopathy, no thyromegaly, nontender                          Heart: RRR, normal S1, S2,  no murmurs                         Lungs: +bronchial breath sounds, +scattered rhonchi, expiratory wheezes diffusely, no rales. Speaking in short sentences due to cough.                 Extremities: no edema, nontender      Assessment: Acute bronchitis, unspecified organism - Plan: albuterol (PROVENTIL) (2.5 MG/3ML) 0.083% nebulizer solution 2.5 mg   Plan:  Medication  orders today include: Albuterol breathing treatment in office.  Z-pak, Tessalon.   Post breathing treatment she notices improvement and a more productive cough. Pulse ox now 98%. Lungs without wheezing.  Discussed diagnosis and treatment of bronchitis.  Suggested symptomatic OTC remedies for cough and congestion.  Tylenol or Ibuprofen OTC for fever and malaise.  Call/return if symptoms are worse or not back to baseline at day 10 after starting the antibiotic.  Advised that cough may linger even after the infection is improved.

## 2017-01-18 ENCOUNTER — Encounter: Payer: Self-pay | Admitting: Family Medicine

## 2017-01-20 ENCOUNTER — Ambulatory Visit
Admission: RE | Admit: 2017-01-20 | Discharge: 2017-01-20 | Disposition: A | Discharge status: Home / Self Care | Payer: 59 | Source: Ambulatory Visit | Attending: Family Medicine | Admitting: Family Medicine

## 2017-01-20 ENCOUNTER — Encounter: Payer: Self-pay | Admitting: Family Medicine

## 2017-01-20 ENCOUNTER — Other Ambulatory Visit: Payer: Self-pay | Admitting: Family Medicine

## 2017-01-20 ENCOUNTER — Ambulatory Visit (INDEPENDENT_AMBULATORY_CARE_PROVIDER_SITE_OTHER): Payer: 59 | Admitting: Family Medicine

## 2017-01-20 VITALS — BP 124/80 | HR 97 | Temp 98.1°F | Resp 20 | Wt 388.4 lb

## 2017-01-20 DIAGNOSIS — J40 Bronchitis, not specified as acute or chronic: Secondary | ICD-10-CM

## 2017-01-20 DIAGNOSIS — R058 Other specified cough: Secondary | ICD-10-CM

## 2017-01-20 DIAGNOSIS — R05 Cough: Secondary | ICD-10-CM | POA: Diagnosis not present

## 2017-01-20 MED ORDER — ALBUTEROL SULFATE (2.5 MG/3ML) 0.083% IN NEBU
2.5000 mg | INHALATION_SOLUTION | Freq: Once | RESPIRATORY_TRACT | Status: AC
  Administered 2017-01-20: 2.5 mg via RESPIRATORY_TRACT

## 2017-01-20 MED ORDER — BENZONATATE 200 MG PO CAPS: 200.0000 mg | ORAL_CAPSULE | Freq: Two times a day (BID) | ORAL | 0 refills | Status: DC | PRN

## 2017-01-20 MED ORDER — ALBUTEROL SULFATE HFA 108 (90 BASE) MCG/ACT IN AERS: 2.0000 | INHALATION_SPRAY | Freq: Four times a day (QID) | RESPIRATORY_TRACT | 0 refills | Status: AC | PRN

## 2017-01-20 MED ORDER — PREDNISONE 10 MG (21) PO TBPK: 10.0000 mg | ORAL_TABLET | Freq: Every day | ORAL | 0 refills | Status: DC

## 2017-01-20 MED ORDER — AMOXICILLIN-POT CLAVULANATE 875-125 MG PO TABS: 1.0000 | ORAL_TABLET | Freq: Two times a day (BID) | ORAL | 0 refills | Status: DC

## 2017-01-20 NOTE — Progress Notes (Signed)
Subjective:  Allison BellowShontae T Mays is a 46 y.o. female who presents for a 5- 6 week history of dry cough. States she had an acute URI in early January and was treated with Z-pack and Tessalon. States symptoms went away for about 1 week and returned. Cough has not resolved and has gotten worse. State she is unable to do her job because she has to talk a lot. Cough is also keeping her awake. Chest feels tight.   Denies fever, chills, body aches, rhinorrhea, nasal congestion, ear pain, sore throat, chest pain, palpitations, GI symptoms.   Treatment to date: Delsym, Robitussin, claritin.  Denies sick contacts.  No other aggravating or relieving factors.  No other c/o.  ROS as in subjective.   Objective: Vitals:   01/20/17 0923 01/20/17 1027  BP: 124/80   Pulse: 90 97  Resp: 20   Temp: 98.1 F (36.7 C)     General appearance: Alert, WD/WN, no distress, mildly ill appearing                             Skin: warm, no rash                           Head: no sinus tenderness                            Eyes: conjunctiva normal, corneas clear, PERRLA                            Ears: pearly TMs, external ear canals normal                          Nose: septum midline, turbinates swollen, with erythema and clear discharge             Mouth/throat: MMM, tongue normal, mild pharyngeal erythema                           Neck: supple, no adenopathy, no thyromegaly, nontender                          Heart: RRR, normal S1, S2, no murmurs                         Lungs: mild expiratory wheezes, scattered rhonchi, no rales      Assessment: Bronchitis - Plan: albuterol (PROVENTIL) (2.5 MG/3ML) 0.083% nebulizer solution 2.5 mg  Cough present for greater than 3 weeks - Plan: DG Chest 2 View, albuterol (PROVENTIL) (2.5 MG/3ML) 0.083% nebulizer solution 2.5 mg   Plan: Oxygen sats 94% and unable to take a deep breath without coughing. Breathing treatment given in office. Peak flow went from 260 to 360 and  patient verbalized feeling somewhat better. Oxygen sats remain at 94%. No respiratory distress. Plan to send her for a chest XR.  Discussed diagnosis and treatment of bronchitis. Steroid dose pack and Tessalon prescribed  Suggested symptomatic OTC remedies.  She will follow up if not back to baseline after completing the antibiotic. Will follow up after chest XR.

## 2017-01-21 ENCOUNTER — Encounter: Payer: Self-pay | Admitting: Family Medicine

## 2017-01-21 ENCOUNTER — Ambulatory Visit (INDEPENDENT_AMBULATORY_CARE_PROVIDER_SITE_OTHER): Payer: 59 | Admitting: Family Medicine

## 2017-01-21 VITALS — BP 116/78 | HR 71 | Temp 97.3°F | Resp 18 | Wt 388.0 lb

## 2017-01-21 DIAGNOSIS — I517 Cardiomegaly: Secondary | ICD-10-CM | POA: Diagnosis not present

## 2017-01-21 DIAGNOSIS — I10 Essential (primary) hypertension: Secondary | ICD-10-CM

## 2017-01-21 DIAGNOSIS — R05 Cough: Secondary | ICD-10-CM | POA: Diagnosis not present

## 2017-01-21 DIAGNOSIS — R0683 Snoring: Secondary | ICD-10-CM

## 2017-01-21 DIAGNOSIS — R0989 Other specified symptoms and signs involving the circulatory and respiratory systems: Secondary | ICD-10-CM

## 2017-01-21 DIAGNOSIS — R058 Other specified cough: Secondary | ICD-10-CM

## 2017-01-21 DIAGNOSIS — G479 Sleep disorder, unspecified: Secondary | ICD-10-CM

## 2017-01-21 DIAGNOSIS — R9431 Abnormal electrocardiogram [ECG] [EKG]: Secondary | ICD-10-CM

## 2017-01-21 LAB — TSH: TSH: 2.1 m[IU]/L

## 2017-01-21 LAB — BRAIN NATRIURETIC PEPTIDE: Brain Natriuretic Peptide: 22.3 pg/mL (ref <100)

## 2017-01-21 MED ORDER — FUROSEMIDE 20 MG PO TABS: 20.0000 mg | ORAL_TABLET | Freq: Every day | ORAL | 0 refills | Status: DC

## 2017-01-21 MED ORDER — POTASSIUM CHLORIDE ER 10 MEQ PO TBCR: 10.0000 meq | EXTENDED_RELEASE_TABLET | Freq: Every day | ORAL | 1 refills | Status: DC

## 2017-01-21 NOTE — Patient Instructions (Addendum)
Start Lasix and potassium.   Return in the morning for fasting lipids.   Limit your salt intake.  Weight yourself daily and if you gain more than 2 lbs in one day call us.   Cardiology office will call you to schedule a visit.   You will get a phone call about sleep study.   DASH Eating Plan DASH stands for "Dietary Approaches to Stop Hypertension." The DASH eating plan is a healthy eating plan that has been shown to reduce high blood pressure (hypertension). Additional health benefits may include reducing the risk of type 2 diabetes mellitus, heart disease, and stroke. The DASH eating plan may also help with weight loss. What do I need to know about the DASH eating plan? For the DASH eating plan, you will follow these general guidelines:  Choose foods with less than 150 milligrams of sodium per serving (as listed on the food label).  Use salt-free seasonings or herbs instead of table salt or sea salt.  Check with your health care provider or pharmacist before using salt substitutes.  Eat lower-sodium products. These are often labeled as "low-sodium" or "no salt added."  Eat fresh foods. Avoid eating a lot of canned foods.  Eat more vegetables, fruits, and low-fat dairy products.  Choose whole grains. Look for the word "whole" as the first word in the ingredient list.  Choose fish and skinless chicken or [place] more often than red meat. Limit fish, poultry, and meat to 6 oz (170 g) each day.  Limit sweets, desserts, sugars, and sugary drinks.  Choose heart-healthy fats.  Eat more home-cooked food and less restaurant, buffet, and fast food.  Limit fried foods.  Do not fry foods. Cook foods using methods such as baking, boiling, grilling, and broiling instead.  When eating at a restaurant, ask that your food be prepared with less salt, or no salt if possible. What foods can I eat? Seek help from a dietitian for individual calorie needs. Grains  Whole grain or whole wheat  bread. Brown rice. Whole grain or whole wheat pasta. Quinoa, bulgur, and whole grain cereals. Low-sodium cereals. Corn or whole wheat flour tortillas. Whole grain cornbread. Whole grain crackers. Low-sodium crackers. Vegetables  Fresh or frozen vegetables (raw, steamed, roasted, or grilled). Low-sodium or reduced-sodium tomato and vegetable juices. Low-sodium or reduced-sodium tomato sauce and paste. Low-sodium or reduced-sodium canned vegetables. Fruits  All fresh, canned (in natural juice), or frozen fruits. Meat and Other Protein Products  Ground beef (85% or leaner), grass-fed beef, or beef trimmed of fat. Skinless chicken or [place]key. Ground chicken or [place]key. Pork trimmed of fat. All fish and seafood. Eggs. Dried beans, peas, or lentils. Unsalted nuts and seeds. Unsalted canned beans. Dairy  Low-fat dairy products, such as skim or 1% milk, 2% or reduced-fat cheeses, low-fat ricotta or cottage cheese, or plain low-fat yogurt. Low-sodium or reduced-sodium cheeses. Fats and Oils  Tub margarines without trans fats. Light or reduced-fat mayonnaise and salad dressings (reduced sodium). Avocado. Safflower, olive, or canola oils. Natural peanut or almond butter. Other  Unsalted popcorn and pretzels. The items listed above may not be a complete list of recommended foods or beverages. Contact your dietitian for more options.  What foods are not recommended? Grains  White bread. White pasta. White rice. Refined cornbread. Bagels and croissants. Crackers that contain trans fat. Vegetables  Creamed or fried vegetables. Vegetables in a cheese sauce. Regular canned vegetables. Regular canned tomato sauce and paste. Regular tomato and vegetable juices. Fruits  Canned fruit in light or heavy syrup. Fruit juice. Meat and Other Protein Products  Fatty cuts of meat. Ribs, chicken wings, bacon, sausage, bologna, salami, chitterlings, fatback, hot dogs, bratwurst, and packaged luncheon meats. Salted nuts and  seeds. Canned beans with salt. Dairy  Whole or 2% milk, cream, half-and-half, and cream cheese. Whole-fat or sweetened yogurt. Full-fat cheeses or blue cheese. Nondairy creamers and whipped toppings. Processed cheese, cheese spreads, or cheese curds. Condiments  Onion and garlic salt, seasoned salt, table salt, and sea salt. Canned and packaged gravies. Worcestershire sauce. Tartar sauce. Barbecue sauce. Teriyaki sauce. Soy sauce, including reduced sodium. Steak sauce. Fish sauce. Oyster sauce. Cocktail sauce. Horseradish. Ketchup and mustard. Meat flavorings and tenderizers. Bouillon cubes. Hot sauce. Tabasco sauce. Marinades. Taco seasonings. Relishes. Fats and Oils  Butter, stick margarine, lard, shortening, ghee, and bacon fat. Coconut, palm kernel, or palm oils. Regular salad dressings. Other  Pickles and olives. Salted popcorn and pretzels. The items listed above may not be a complete list of foods and beverages to avoid. Contact your dietitian for more information.  Where can I find more information? National Heart, Lung, and Blood Institute: [employer] This information is not intended to replace advice given to you by your health care provider. Make sure you discuss any questions you have with your health care provider. Document Released: 11/26/2011 Document Revised: 05/14/2016 Document Reviewed: 10/11/2013 Elsevier Interactive Patient Education  2017 [employer].

## 2017-01-21 NOTE — Progress Notes (Signed)
Subjective:    Patient ID: Allison Mays, female    DOB: 1971-01-17, 46 y.o.   MRN: [id number]  HPI Chief Complaint  Patient presents with  . follow-up on xray    follow-up on xray   She is a 46 year old african [place] female with a history of severe obesity and HTN who is here to follow up on abnormal chest XR result from yesterday. States her cough has improved somewhat. Started taking antibiotic and steroids this morning.  XR showed some possible early heart failure so this needs further investigation.  She admits that she has had some DOE over the past month or so.  She also admits that she snores, wakes up several times during the night and unclear if she has periods of apnea. She does report daytime sleepiness and falling asleep often if sitting still.   She has HTN that appears to be well controlled. She does not check her BP at home. Taking daily HCTZ 12.5 mg.   Denies fever, chills, chest pain, palpitations, orthopnea, abdominal pain, GI or GU symptoms. No LE edema. No calf pain or swelling. Sleeps on 2 pillows.  Denies history of reflux or indigestion.   History of hysterectomy for large fibroids 16 years ago.   History of chronic knee pain, left worse than right with bilateral knee injections in the past.   States cholesterol was checked last fall and she was told is was normal. This was done through her employer. Will get these records.  She is not fasting today.   States father died of ?thoracic aneurysm at age 12. Mother and brother with HTN.   Reviewed allergies, medications, past medical, surgical, family, and social history.    Review of Systems Pertinent positives and negatives in the history of present illness.     Objective:   Physical Exam BP 116/78   Pulse 71   Temp 97.3 F (36.3 C) (Oral)   Resp 18   Wt (!) 388 lb (176 kg)   SpO2 95%   BMI 59.87 kg/m  Alert and in no distress. Obese female.  Cardiac exam shows a regular sinus rhythm  without murmurs or gallops. Lungs are clear to auscultation. Trace pitting edema to bilateral LE.       Assessment & Plan:  Cardiomegaly - Plan: EKG 12-Lead, Brain natriuretic peptide, furosemide (LASIX) 20 MG tablet, Lipid panel, Ambulatory referral to Cardiology  Severe obesity (BMI >= 40) (HCC) - Plan: TSH, Split night study  Hypertension, unspecified type - Plan: EKG 12-Lead, Brain natriuretic peptide, Lipid panel, Ambulatory referral to Cardiology  Cough present for greater than 3 weeks - Plan: Brain natriuretic peptide, furosemide (LASIX) 20 MG tablet, Ambulatory referral to Cardiology  Pulmonary vascular congestion - Plan: EKG 12-Lead, Brain natriuretic peptide, furosemide (LASIX) 20 MG tablet, Lipid panel, Ambulatory referral to Cardiology  Abnormal EKG - Plan: Ambulatory referral to Cardiology  Sleep disturbance - Plan: Split night study  Snoring - Plan: Split night study  Her blood pressure appears well controlled. Continue current medication. DASH diet provided.  Discussed that we will treat her as if she some early heart failure.  ECG shows NSR with rate of 83.  nonspecific T wave, questionable prolonged QT and LVH.    Start taking daily lasix and potassium.  Limit salt intake. Keep eye on daily weights and report back any weights >2 lbs in one day.  Referral to cardiology for further evaluation.    I am highly suspicious of  sleep apnea in this patient based on history and elevated Epworth sleep scale.  Plan to refer for a sleep study.  She is not fasting today. Will return for fasting lipids in the morning.

## 2017-01-22 ENCOUNTER — Other Ambulatory Visit: Payer: 59

## 2017-01-22 DIAGNOSIS — I10 Essential (primary) hypertension: Secondary | ICD-10-CM

## 2017-01-22 DIAGNOSIS — R0989 Other specified symptoms and signs involving the circulatory and respiratory systems: Secondary | ICD-10-CM

## 2017-01-22 DIAGNOSIS — I517 Cardiomegaly: Secondary | ICD-10-CM

## 2017-01-22 LAB — LIPID PANEL
CHOL/HDL RATIO: 2.7 ratio (ref <5.0)
CHOLESTEROL: 165 mg/dL (ref <200)
HDL: 62 mg/dL (ref >50)
LDL Cholesterol: 93 mg/dL (ref <100)
Triglycerides: 50 mg/dL (ref <150)
VLDL: 10 mg/dL (ref <30)

## 2017-01-25 ENCOUNTER — Encounter: Payer: Self-pay | Admitting: Family Medicine

## 2017-02-05 ENCOUNTER — Ambulatory Visit (INDEPENDENT_AMBULATORY_CARE_PROVIDER_SITE_OTHER): Payer: 59 | Admitting: Physician Assistant

## 2017-02-05 ENCOUNTER — Ambulatory Visit: Payer: 59 | Admitting: Physician Assistant

## 2017-02-05 ENCOUNTER — Encounter: Payer: Self-pay | Admitting: Physician Assistant

## 2017-02-05 VITALS — BP 152/100 | HR 92 | Ht 67.5 in | Wt 392.0 lb

## 2017-02-05 DIAGNOSIS — R0989 Other specified symptoms and signs involving the circulatory and respiratory systems: Secondary | ICD-10-CM

## 2017-02-05 DIAGNOSIS — I10 Essential (primary) hypertension: Secondary | ICD-10-CM | POA: Diagnosis not present

## 2017-02-05 LAB — BASIC METABOLIC PANEL
BUN: 11 mg/dL (ref 7–25)
CALCIUM: 9.3 mg/dL (ref 8.6–10.2)
CHLORIDE: 101 mmol/L (ref 98–110)
CO2: 29 mmol/L (ref 20–31)
CREATININE: 0.89 mg/dL (ref 0.50–1.10)
Glucose, Bld: 87 mg/dL (ref 65–99)
Potassium: 3.9 mmol/L (ref 3.5–5.3)
Sodium: 141 mmol/L (ref 135–146)

## 2017-02-05 NOTE — Progress Notes (Signed)
Cardiology Office Note    Date:  02/05/2017   ID:  Allison Mays, DOB 11-20-1971, MRN [id number]  PCP:  Hetty Blend, NP  Cardiologist:  New (discussed with Dr. Antoine Poche)   Chief Complaint  Patient presents with  . New Patient (Initial Visit)    discussed the case with Dr Antoine Poche. Referred by Hetty Blend NP for possible heart failure    History of Present Illness:  Allison Mays is a 46 y.o. female with PMH of HTN and morbid obesity. She was recently seen by her PCP for dyspnea or cough that has been ongoing for greater than 3 weeks. Chest x-ray however revealed mild vascular congestion and cardiomegaly, no focal infiltrate. She was treated with antibiotic and a course of steroid. She also complained of significant snoring issue, she was referred for sleep study to rule out obstructive sleep apnea. Recent BNP obtained on 01/21/2017 was negative. Lipid panel shows Lester 165, HDL 62, LDL 93, triglycerides 50. TSH was normal as well. EKG obtained in the office showed normal sinus rhythm with T-wave inversion in the lateral leads. She was referred to cardiology service to rule out possible heart failure symptoms.  Patient was seen in the cardiology office today. She says she has been on 12.5 mg HCTZ for many years, and was started on Lasix 20 mg daily since Feb 1st. She is also taking 10 mEq potassium supplement daily. She has finished a course of Augmentin and prednisone Dosepak, her symptom has completely resolved. She has not had any issues since starting Lasix on 01/21/2017. She denies ever having any chest pain. We will obtain a basic metabolic panel to assess her renal function and potassium level since starting the Lasix. Otherwise without any angina, I would not recommend any ischemic workup. I would recommend an echocardiogram as sometimes BNP can be falsely low inpatient while morbidly obese. Therefore low BNP does not completely rule out heart failure. It is very possible that her  recent symptom is upper respiratory infection instead. Retrospectively, it is very difficult to tell. I will see her back in 2 months for reassessment. She is at risk for diastolic heart failure given her morbid obesity.   Past Medical History:  Diagnosis Date  . Hypertension    has taken atenolol in the past per records from [facility]  . Obesity     Past Surgical History:  Procedure Laterality Date  . ABDOMINAL HYSTERECTOMY  2003  . CHOLECYSTECTOMY N/A 07/02/2015   Procedure: LAPAROSCOPIC CHOLECYSTECTOMY;  Surgeon: Abigail Miyamoto, MD;  Location: [facility] OR;  Service: General;  Laterality: N/A;    Current Medications: Outpatient Medications Prior to Visit  Medication Sig Dispense Refill  . albuterol (PROVENTIL HFA;VENTOLIN HFA) 108 (90 Base) MCG/ACT inhaler Inhale 2 puffs into the lungs every 6 (six) hours as needed for wheezing or shortness of breath. 1 Inhaler 0  . furosemide (LASIX) 20 MG tablet Take 1 tablet (20 mg total) by mouth daily. 30 tablet 0  . hydrochlorothiazide (MICROZIDE) 12.5 MG capsule Take 1 capsule (12.5 mg total) by mouth daily. 30 capsule 2  . potassium chloride (K-DUR) 10 MEQ tablet Take 1 tablet (10 mEq total) by mouth daily. 30 tablet 1  . amoxicillin-clavulanate (AUGMENTIN) 875-125 MG tablet Take 1 tablet by mouth 2 (two) times daily. (Patient not taking: Reported on 02/05/2017) 20 tablet 0  . benzonatate (TESSALON) 200 MG capsule Take 1 capsule (200 mg total) by mouth 2 (two) times daily as needed for cough. (  Patient not taking: Reported on 02/05/2017) 20 capsule 0  . predniSONE (STERAPRED UNI-PAK 21 TAB) 10 MG (21) TBPK tablet Take 1 tablet (10 mg total) by mouth daily. (Patient not taking: Reported on 02/05/2017) 21 tablet 0   No facility-administered medications prior to visit.      Allergies:   Lisinopril   Social History   Social History  . Marital status: Married    Spouse name: N/A  . Number of children: N/A  . Years of education: N/A    Social History Main Topics  . Smoking status: Never Smoker  . Smokeless tobacco: Never Used  . Alcohol use Yes     Comment: 2 glasses of wine every other day  . Drug use: No  . Sexual activity: Not Asked   Other Topics Concern  . None   Social History Narrative  . None     Family History:  The patient's family history includes Aneurysm in her father; Diabetes in her paternal grandmother; Hypertension in her brother, father, and mother.   ROS:   Please see the history of present illness.    ROS All other systems reviewed and are negative.   PHYSICAL EXAM:   VS:  BP (!) 152/100   Pulse 92   Ht 5' 7.5" (1.715 m)   Wt (!) 392 lb (177.8 kg)   SpO2 95%   BMI 60.49 kg/m    GEN: Well nourished, well developed, in no acute distress  HEENT: normal  Neck: no JVD, carotid bruits, or masses Cardiac: RRR; no murmurs, rubs, or gallops,no edema  Respiratory:  clear to auscultation bilaterally, normal work of breathing GI: soft, nontender, nondistended, + BS MS: no deformity or atrophy  Skin: warm and dry, no rash Neuro:  Alert and Oriented x 3, Strength and sensation are intact Psych: euthymic mood, full affect  Wt Readings from Last 3 Encounters:  02/05/17 (!) 392 lb (177.8 kg)  01/21/17 (!) 388 lb (176 kg)  01/20/17 (!) 388 lb 6.4 oz (176.2 kg)      Studies/Labs Reviewed:   EKG:  EKG is not ordered today.  Recent Labs: 10/28/2016: ALT 22; BUN 10; Creat 0.82; Hemoglobin 12.1; Platelets 332; Potassium 4.1; Sodium 142 01/21/2017: Brain Natriuretic Peptide 22.3; TSH 2.10   Lipid Panel    Component Value Date/Time   CHOL 165 01/22/2017 0818   TRIG 50 01/22/2017 0818   HDL 62 01/22/2017 0818   CHOLHDL 2.7 01/22/2017 0818   VLDL 10 01/22/2017 0818   LDLCALC 93 01/22/2017 0818    Additional studies/ records that were reviewed today include:   Record from PCPs office.    ASSESSMENT:    1. Pulmonary vascular congestion   2. Hypertension, unspecified type   3.  Morbid obesity (HCC)      PLAN:  In order of problems listed above:  1. Pulmonary vascular congestion: Chest x-ray showed pulmonary vascular congestion, however no interstitial edema. She was placed on 20 mg daily of Lasix along with a course of steroid and antibiotic, her symptom has completely resolved by this point. There is no sign of heart failure on physical exam. We will pursue echocardiogram to assess baseline cardiac function. With morbid obesity, she is certainly at risk for diastolic heart failure. She does not have any chest pain, I seriously doubt any sign of systolic heart failure. Additional workup will have to be based on the echocardiogram result. I have discussed the plan with DOD Dr. Antoine Poche who was also agreeable. I  do not see that she had any lab work done since started on Lasix, I will draw a basic metabolic panel today to make sure she is not being dehydrated.  2. Hypertension: Her symptoms to be quite well-controlled. It is high today, usually it is around 110 to 120s range. She says was agitated this morning as her daughter's school contacted her regarding a possible firearm on the school campus. And with the recent school [place] in [place], she is quite concerned.  3. Morbid obesity: Ultimately she will need to lose weight, her PCP recommended sleep study at which I think is a good idea especially with her snoring and history of hypertension.    Medication Adjustments/Labs and Tests Ordered: Current medicines are reviewed at length with the patient today.  Concerns regarding medicines are outlined above.  Medication changes, Labs and Tests ordered today are listed in the Patient Instructions below. Patient Instructions  Medication Instructions:  Your physician recommends that you continue on your current medications as directed. Please refer to the Current Medication list given to you today.  Labwork: Please have lab work TODAY (BMET)  Testing/Procedures: Your  physician has requested that you have an echocardiogram. Echocardiography is a painless test that uses sound waves to create images of your heart. It provides your doctor with information about the size and shape of your heart and how well your heart's chambers and valves are working. This procedure takes approximately one hour. There are no restrictions for this procedure.   Follow-Up: Your physician recommends that you schedule a follow-up appointment in: 2 months with Azalee CourseHao Braidon Chermak PA   Any Other Special Instructions Will Be Listed Below (If Applicable).     If you need a refill on your cardiac medications before your next appointment, please call your pharmacy.      Ramond DialSigned, Marvis Saefong, [place]  02/05/2017 4:37 PM    [facility] [address], [place], [place]  [address] Phone: [phone]; Fax: [phone]

## 2017-02-05 NOTE — Patient Instructions (Signed)
Medication Instructions:  Your physician recommends that you continue on your current medications as directed. Please refer to the Current Medication list given to you today.  Labwork: Please have lab work TODAY (BMET)  Testing/Procedures: Your physician has requested that you have an echocardiogram. Echocardiography is a painless test that uses sound waves to create images of your heart. It provides your doctor with information about the size and shape of your heart and how well your heart's chambers and valves are working. This procedure takes approximately one hour. There are no restrictions for this procedure.   Follow-Up: Your physician recommends that you schedule a follow-up appointment in: 2 months with Azalee CourseHao Meng PA   Any Other Special Instructions Will Be Listed Below (If Applicable).     If you need a refill on your cardiac medications before your next appointment, please call your pharmacy.

## 2017-02-09 NOTE — Progress Notes (Signed)
Please call and see if she is still taking the Lasix. She can stop this if so.

## 2017-02-10 NOTE — Progress Notes (Signed)
Please ask her if she is still taking Lasix or if the cardiologist recommended stopping it or not. I am ok with her stopping it.

## 2017-02-11 ENCOUNTER — Encounter: Payer: Self-pay | Admitting: Family Medicine

## 2017-02-16 ENCOUNTER — Encounter: Payer: Self-pay | Admitting: Family Medicine

## 2017-02-16 ENCOUNTER — Ambulatory Visit
Admission: RE | Admit: 2017-02-16 | Discharge: 2017-02-16 | Disposition: A | Discharge status: Home / Self Care | Payer: 59 | Source: Ambulatory Visit | Attending: Family Medicine | Admitting: Family Medicine

## 2017-02-16 ENCOUNTER — Other Ambulatory Visit: Payer: Self-pay | Admitting: Family Medicine

## 2017-02-16 ENCOUNTER — Ambulatory Visit (INDEPENDENT_AMBULATORY_CARE_PROVIDER_SITE_OTHER): Payer: 59 | Admitting: Family Medicine

## 2017-02-16 VITALS — BP 124/88 | HR 86 | Temp 98.0°F | Wt >= 6400 oz

## 2017-02-16 DIAGNOSIS — W19XXXA Unspecified fall, initial encounter: Secondary | ICD-10-CM

## 2017-02-16 DIAGNOSIS — M549 Dorsalgia, unspecified: Secondary | ICD-10-CM

## 2017-02-16 DIAGNOSIS — M546 Pain in thoracic spine: Secondary | ICD-10-CM | POA: Diagnosis not present

## 2017-02-16 DIAGNOSIS — S8992XA Unspecified injury of left lower leg, initial encounter: Secondary | ICD-10-CM

## 2017-02-16 DIAGNOSIS — Z23 Encounter for immunization: Secondary | ICD-10-CM | POA: Diagnosis not present

## 2017-02-16 DIAGNOSIS — M25562 Pain in left knee: Secondary | ICD-10-CM | POA: Diagnosis not present

## 2017-02-16 DIAGNOSIS — M542 Cervicalgia: Secondary | ICD-10-CM

## 2017-02-16 DIAGNOSIS — S90511A Abrasion, right ankle, initial encounter: Secondary | ICD-10-CM

## 2017-02-16 MED ORDER — CYCLOBENZAPRINE HCL 10 MG PO TABS: 10.0000 mg | ORAL_TABLET | Freq: Three times a day (TID) | ORAL | 0 refills | Status: DC | PRN

## 2017-02-16 NOTE — Progress Notes (Signed)
Subjective:    Patient ID: Allison Mays, female    DOB: 09-Dec-1971, 46 y.o.   MRN: [id number]  HPI Chief Complaint  Patient presents with  . fell at home    fell at home in the bathroom last night, right ankle pain and left leg pain and back pain up to neck   She is here with complaints of pain to her posterior neck, upper back, and left knee after falling last night in her bathtub. States she slipped on water. States her left leg bent up underneath her and her right foot went through the fiberglass on her bathtub. States has a small cut on her right lower leg. Pain is described as muscle "soreness". Pain is worse with movement and palpation.  They cleaned the area with alcohol immediately. Last Td unknown.  States she did not hit her head, no LOC.  Denies having headache, dizziness, chest pain, shortness of breath. No numbness, tingling or weakness.  Left knee is not locking, popping or giving away. Pain is worse with ambulation and improved with rest. Pain to neck and upper back is worse with movement and relieved with rest.  She has not taken anything for pain. States pain is worse today.   Reviewed allergies, medications, past medical,  and social history.   Review of Systems Pertinent positives and negatives in the history of present illness.     Objective:   Physical Exam  Constitutional: She is oriented to person, place, and time. She appears well-developed and well-nourished. No distress.  Morbidly obese  HENT:  Head: Atraumatic.  Right Ear: Tympanic membrane and ear canal normal.  Left Ear: Tympanic membrane and ear canal normal.  Nose: Nose normal.  Mouth/Throat: Uvula is midline, oropharynx is clear and moist and mucous membranes are normal.  Eyes: Conjunctivae are normal. Pupils are equal, round, and reactive to light.  Neck: Trachea normal. Neck supple. Spinous process tenderness and muscular tenderness present.  No step off noted. Painful full ROM.     Cardiovascular: Normal rate, regular rhythm and normal pulses.   Pulmonary/Chest: Effort normal and breath sounds normal. She exhibits no tenderness.  Abdominal: There is no tenderness. There is no CVA tenderness.  Musculoskeletal:       Left knee: She exhibits decreased range of motion. She exhibits no deformity, normal alignment, no LCL laxity and no MCL laxity.       Cervical back: She exhibits tenderness.       Thoracic back: She exhibits tenderness and pain.       Lumbar back: Normal.  Difficult to perform knee exam due to body habitus. Antalgic gait.   Neurological: She is alert and oriented to person, place, and time. No cranial nerve deficit or sensory deficit. Gait abnormal. Coordination normal.  Skin: Skin is warm and dry. No pallor.     Superficial linear abrasion to anterior right lower leg. No bleeding, erythema or edema. No sign of infection.   Psychiatric: She has a normal mood and affect. Her speech is normal and behavior is normal. Thought content normal.   BP 124/88   Pulse 86   Temp 98 F (36.7 C) (Oral)   Wt (!) 402 lb (182.3 kg)   BMI 62.03 kg/m       Assessment & Plan:  Fall, initial encounter - Plan: DG Knee Complete 4 Views Left, DG Cervical Spine Complete, CANCELED: DG Thoracic Spine 2 View  Left knee injury, initial encounter - Plan: DG Knee Complete  4 Views Left  Abrasion of right ankle without infection, initial encounter - Plan: Tdap vaccine greater than or equal to 7yo IM  Acute neck pain - Plan: DG Cervical Spine Complete  Pain, upper back - Plan: CANCELED: DG Thoracic Spine 2 View  She does not appear to have any neurological deficits or concussion symptoms.  Aleve samples given #18 with instructions to take 2 tabs with food twice daily. Flexeril prescribed. Advised to use ice for the next 24 hours and then may switch to heat. She will follow up pending XR results or if she worsens.  tdap updated.

## 2017-02-19 ENCOUNTER — Encounter: Payer: Self-pay | Admitting: Family Medicine

## 2017-02-21 IMAGING — CR DG CHEST 2V
2 series · 2 of 2 positions shown · non-contrast
Comparison: No recent prior.

CLINICAL DATA: Cough and congestion.

EXAM:
CHEST  2 VIEW

[w chest pa *]
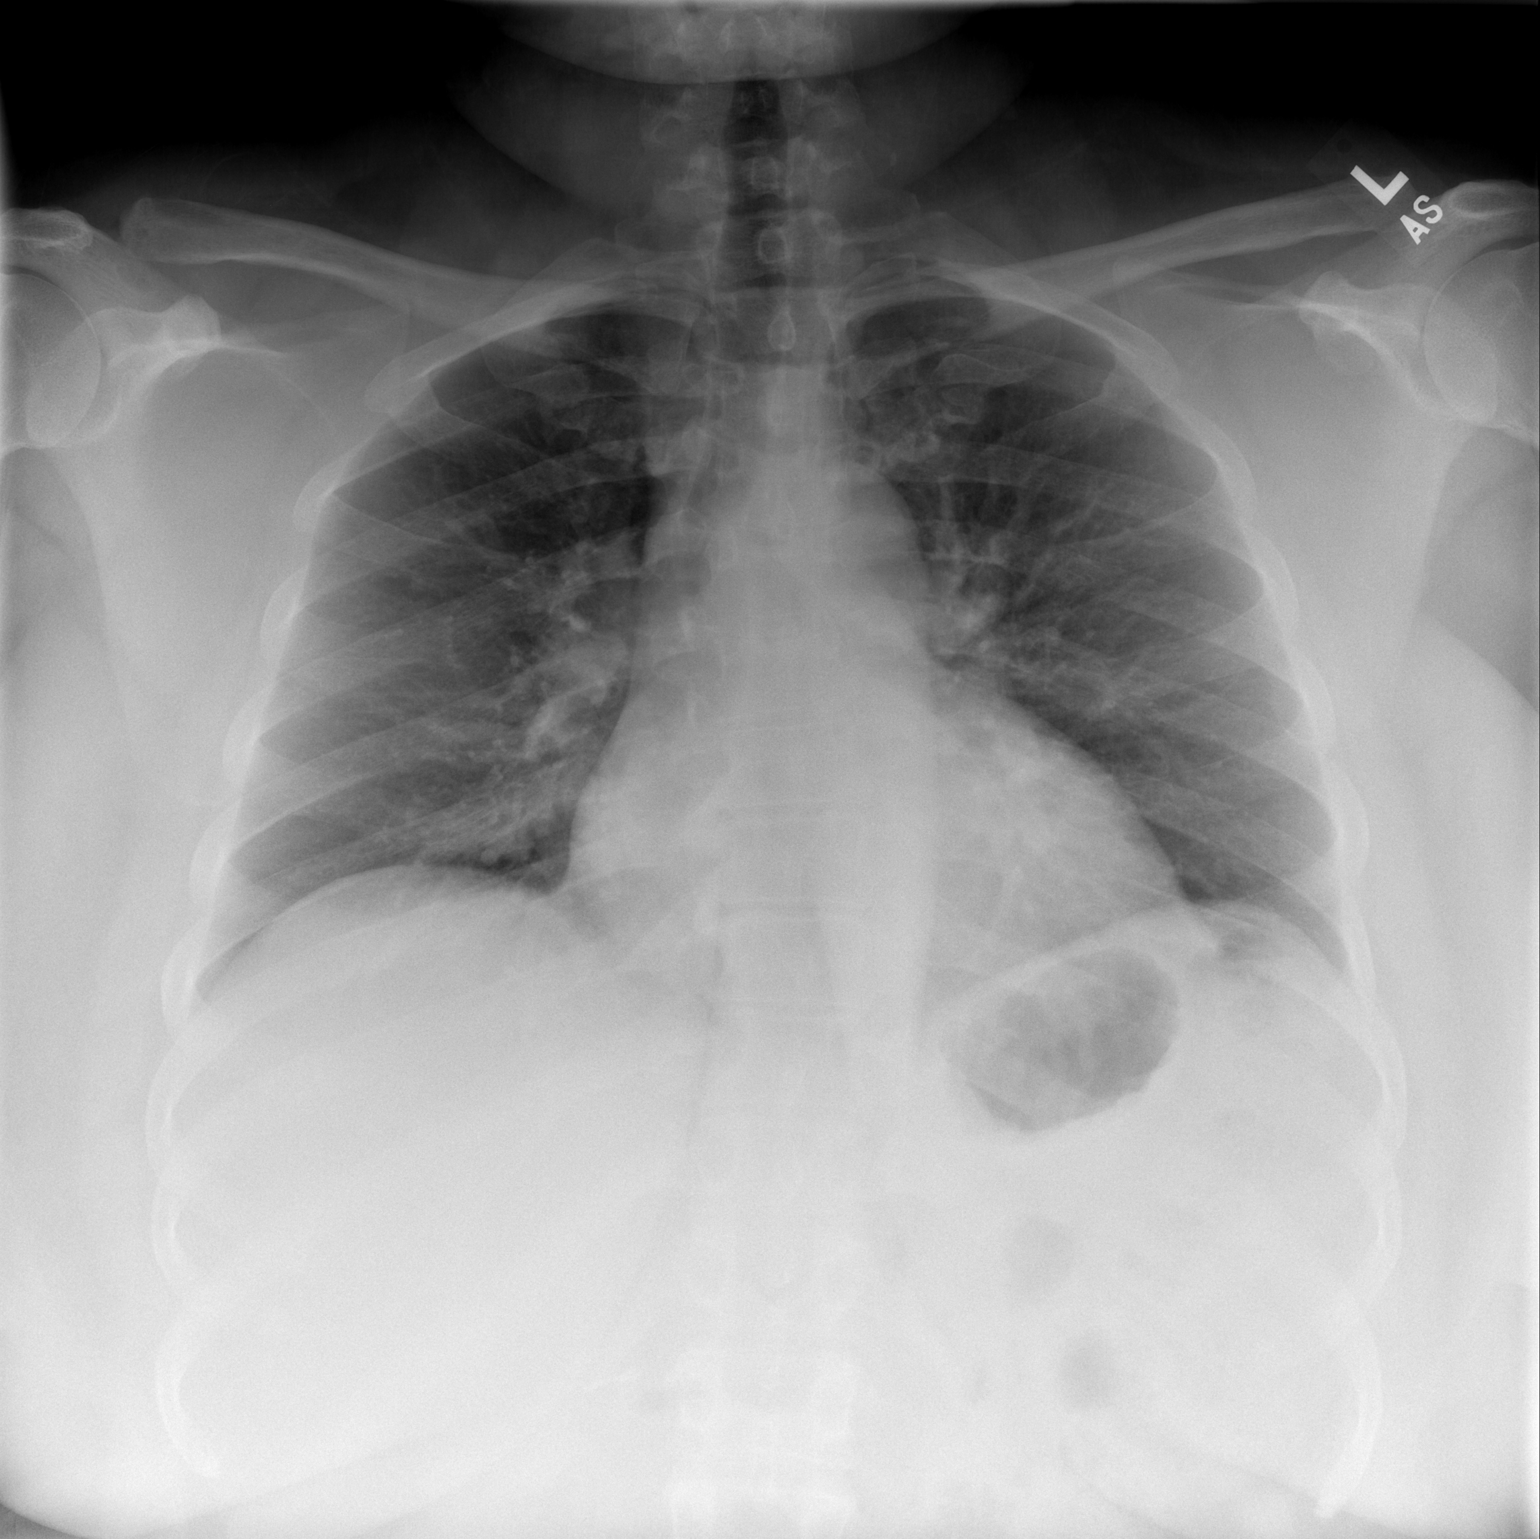

[w chest lat *]
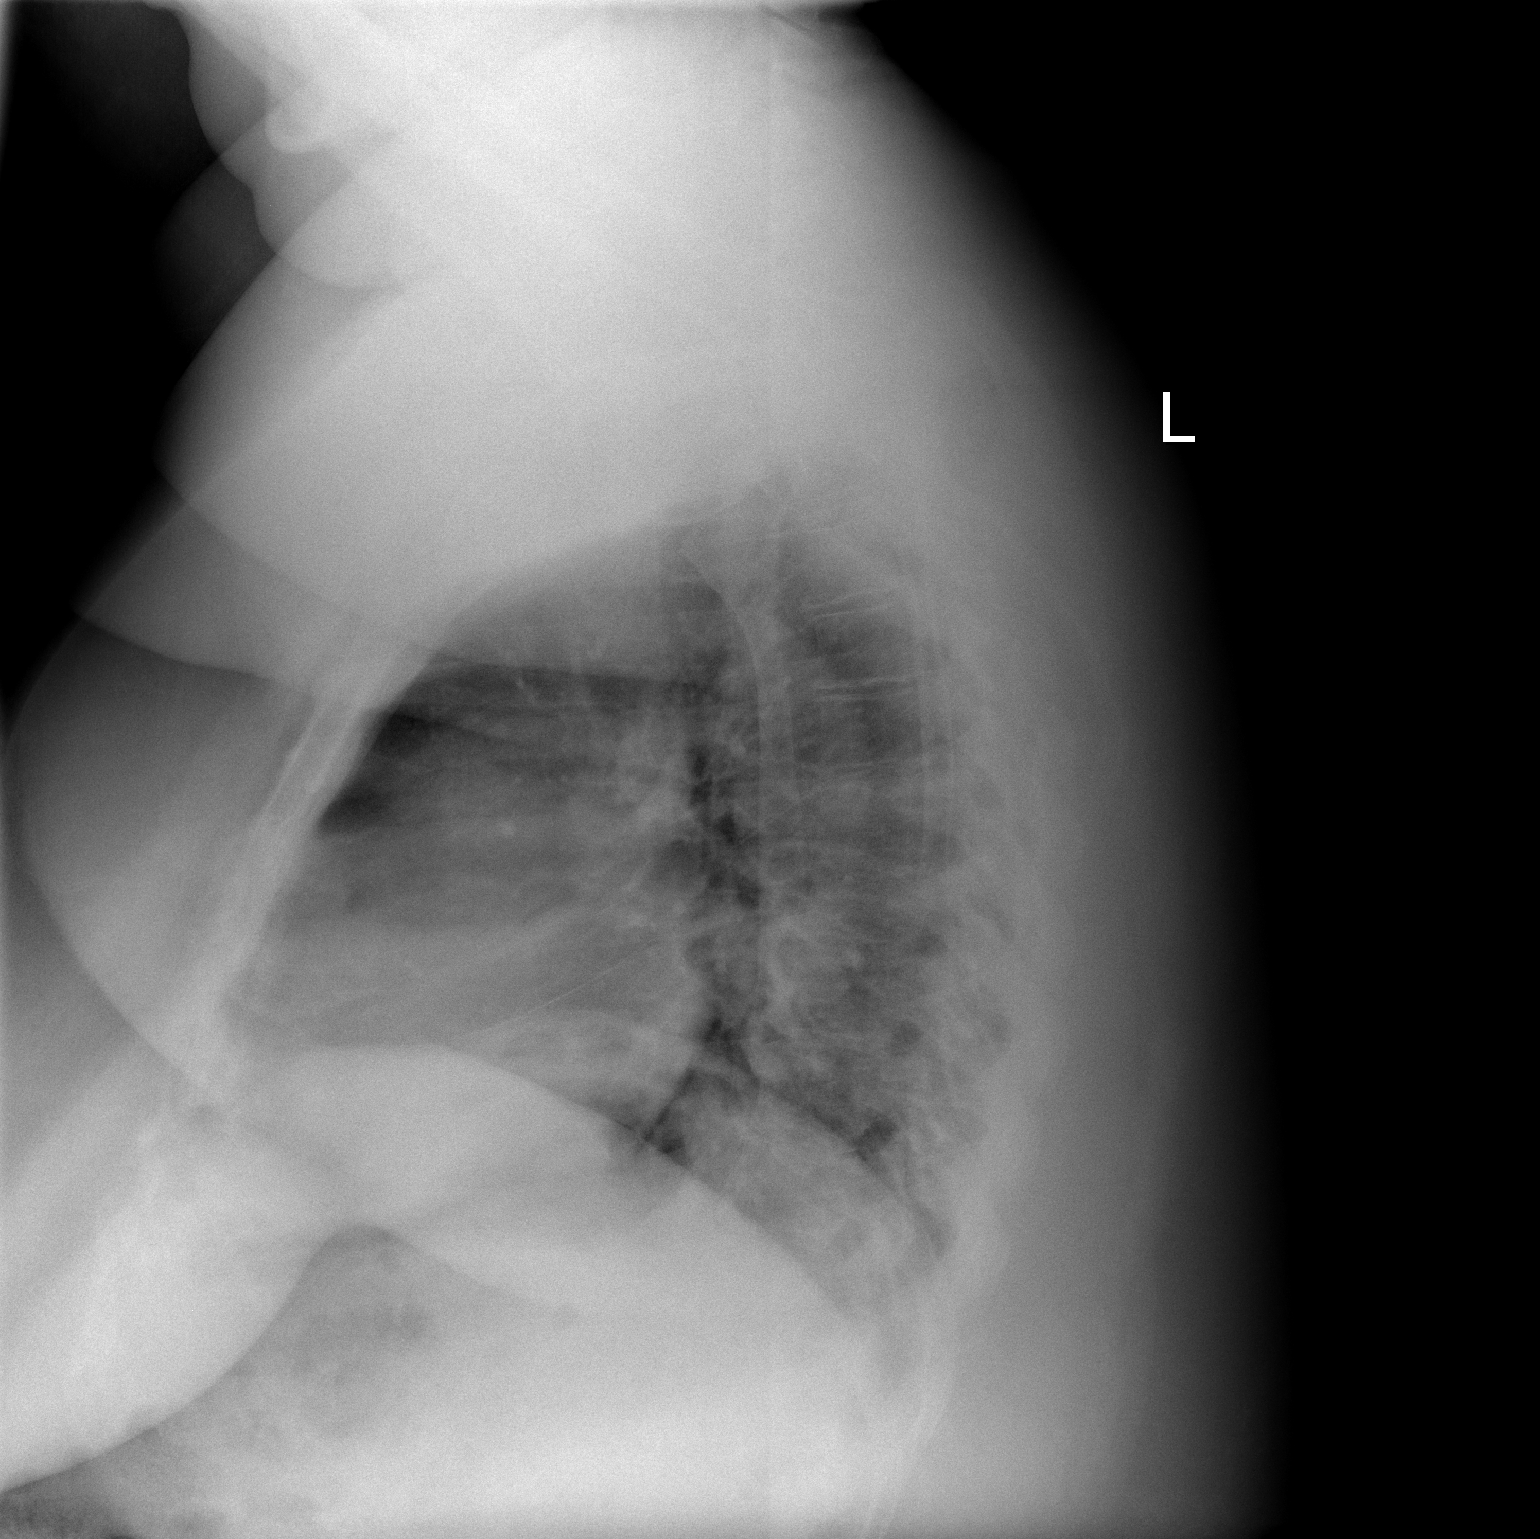

[2 of 2 positions shown; findings below may reference images not displayed]

FINDINGS: Cardiomegaly with mild pulmonary vascular congestion. No focal
infiltrate. No pleural effusion or pneumothorax. No acute bony
abnormality. Degenerative changes thoracic spine.
IMPRESSION: Cardiomegaly with mild pulmonary vascular congestion. No focal
infiltrate.

## 2017-02-23 ENCOUNTER — Ambulatory Visit (HOSPITAL_COMMUNITY): Payer: 59 | Attending: Cardiology

## 2017-02-23 ENCOUNTER — Other Ambulatory Visit: Payer: Self-pay

## 2017-02-23 DIAGNOSIS — R0989 Other specified symptoms and signs involving the circulatory and respiratory systems: Secondary | ICD-10-CM | POA: Insufficient documentation

## 2017-02-23 DIAGNOSIS — I509 Heart failure, unspecified: Secondary | ICD-10-CM | POA: Diagnosis not present

## 2017-02-23 DIAGNOSIS — I10 Essential (primary) hypertension: Secondary | ICD-10-CM | POA: Insufficient documentation

## 2017-02-23 MED ORDER — PERFLUTREN LIPID MICROSPHERE
1.0000 mL | INTRAVENOUS | Status: AC | PRN
  Administered 2017-02-23: 2 mL via INTRAVENOUS

## 2017-02-26 DIAGNOSIS — M5442 Lumbago with sciatica, left side: Secondary | ICD-10-CM | POA: Diagnosis not present

## 2017-02-26 DIAGNOSIS — M545 Low back pain: Secondary | ICD-10-CM | POA: Diagnosis not present

## 2017-03-06 DIAGNOSIS — M5442 Lumbago with sciatica, left side: Secondary | ICD-10-CM | POA: Diagnosis not present

## 2017-03-12 ENCOUNTER — Encounter: Payer: Self-pay | Admitting: Family Medicine

## 2017-03-12 DIAGNOSIS — I10 Essential (primary) hypertension: Secondary | ICD-10-CM

## 2017-03-12 MED ORDER — HYDROCHLOROTHIAZIDE 12.5 MG PO CAPS: 12.5000 mg | ORAL_CAPSULE | Freq: Every day | ORAL | 2 refills | Status: DC

## 2017-03-17 DIAGNOSIS — M5442 Lumbago with sciatica, left side: Secondary | ICD-10-CM | POA: Diagnosis not present

## 2017-03-20 IMAGING — DX DG KNEE COMPLETE 4+V*L*
4 series · 4 of 4 positions shown · non-contrast
Comparison: Left knee series of April 03, 2014

CLINICAL DATA: Status post fall 1 day ago with persistent left knee
pain, abnormal gait.

EXAM:
LEFT KNEE - COMPLETE 4+ VIEW

[dg knee complete 4 views left (1 of 4)]
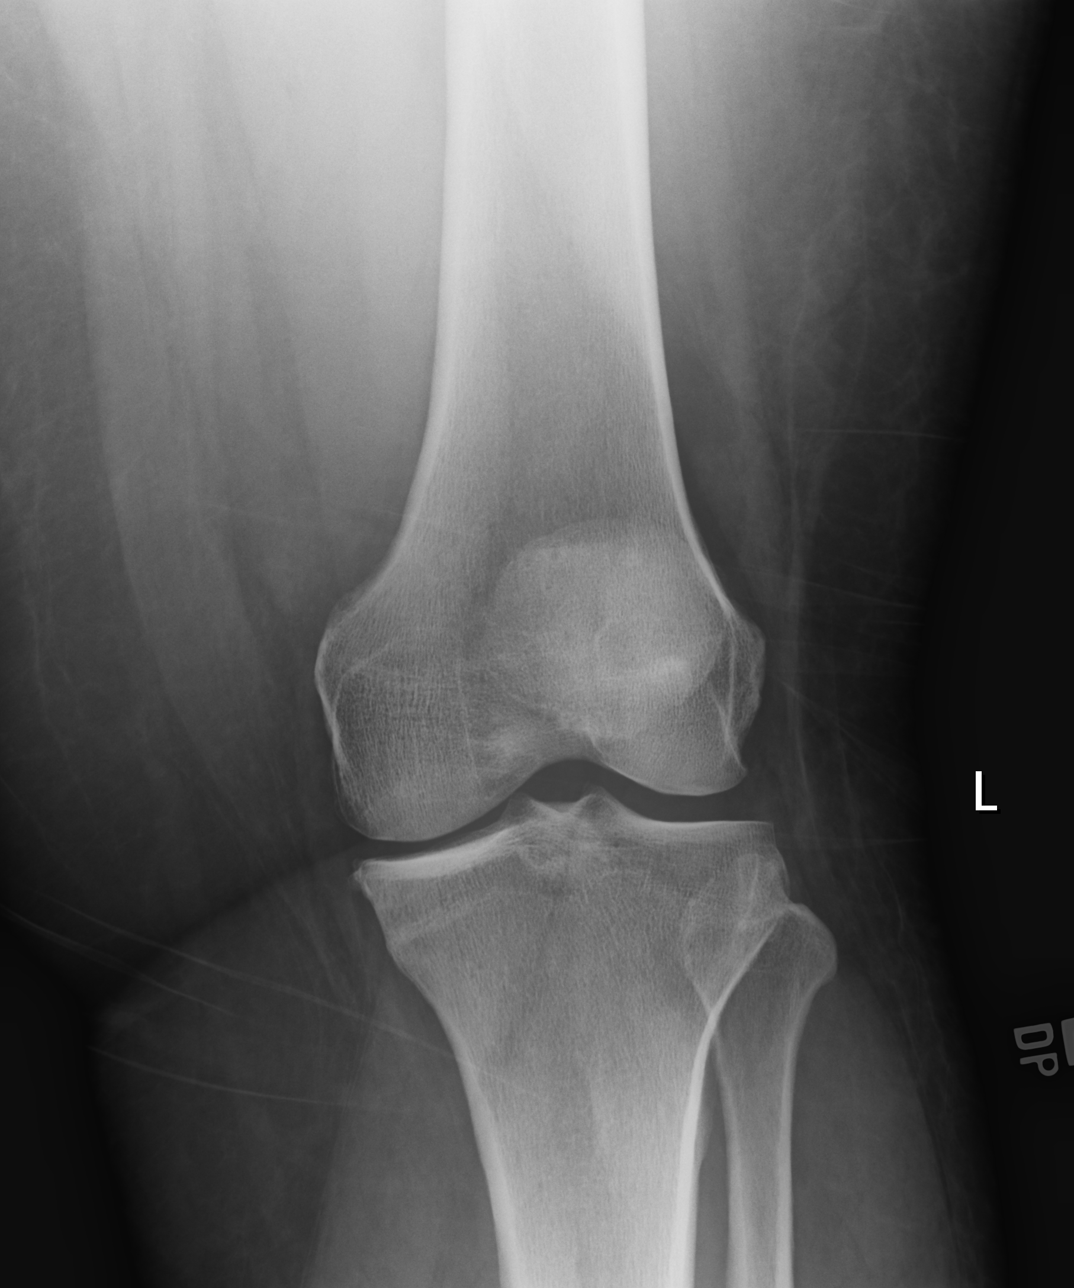

[dg knee complete 4 views left (2 of 4)]
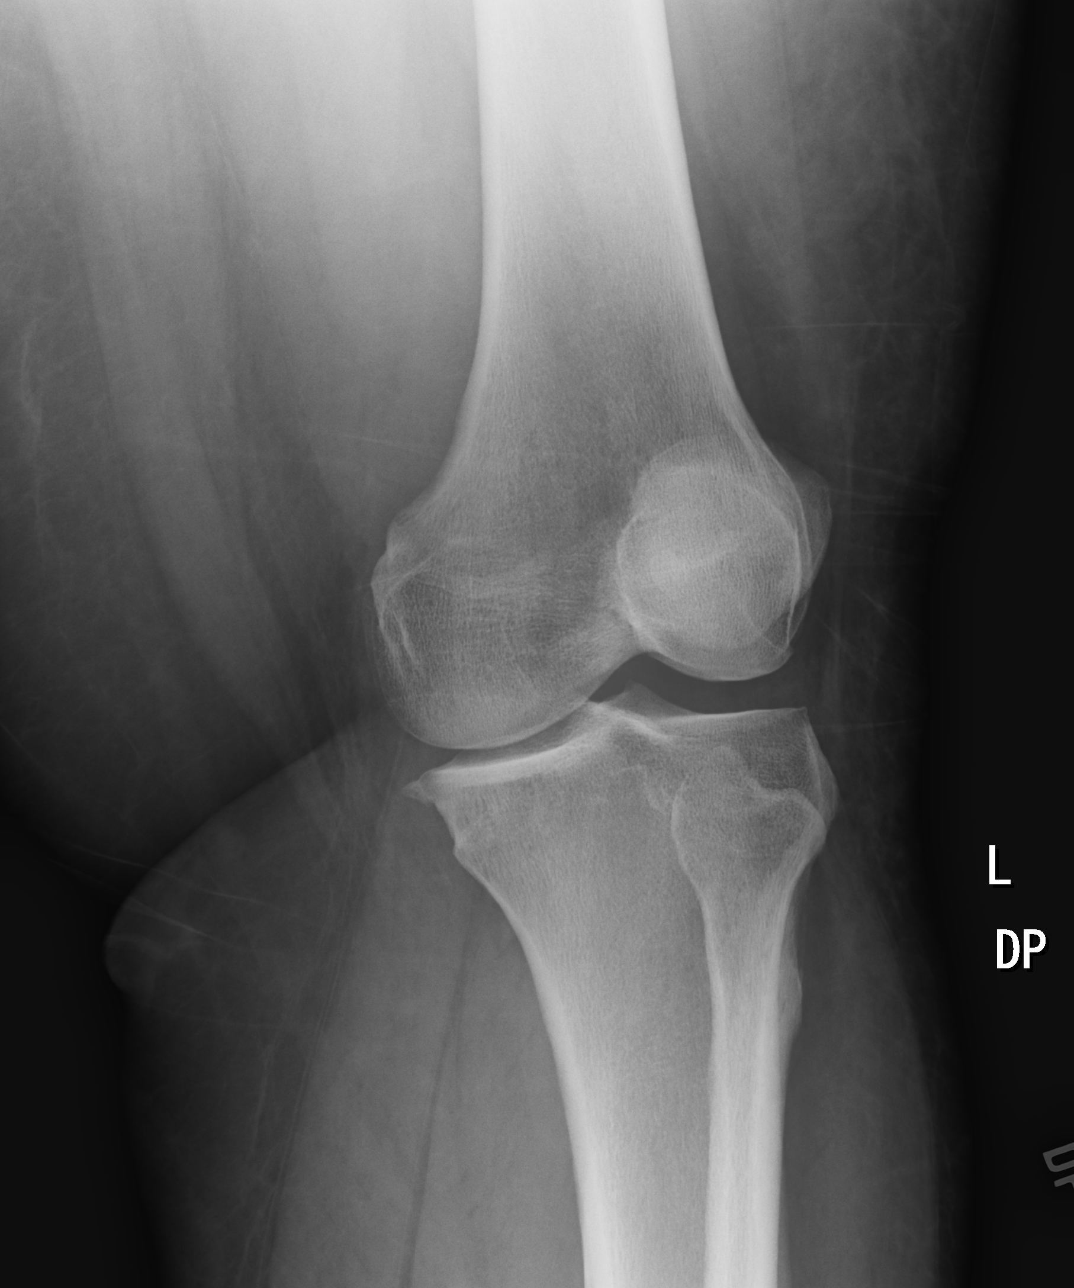

[dg knee complete 4 views left (3 of 4)]
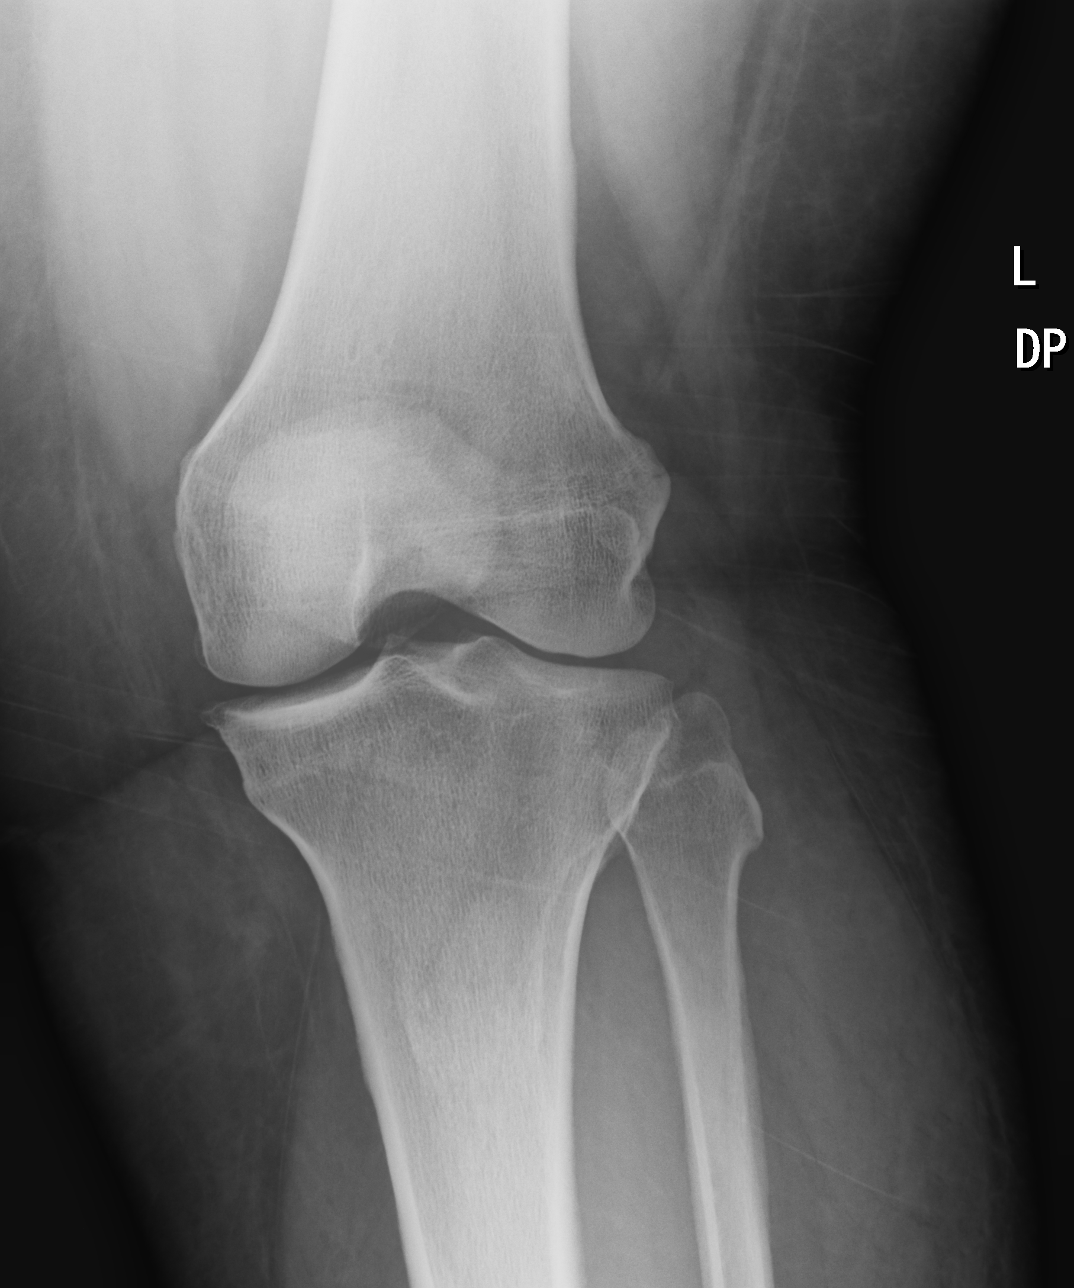

[dg knee complete 4 views left (4 of 4)]
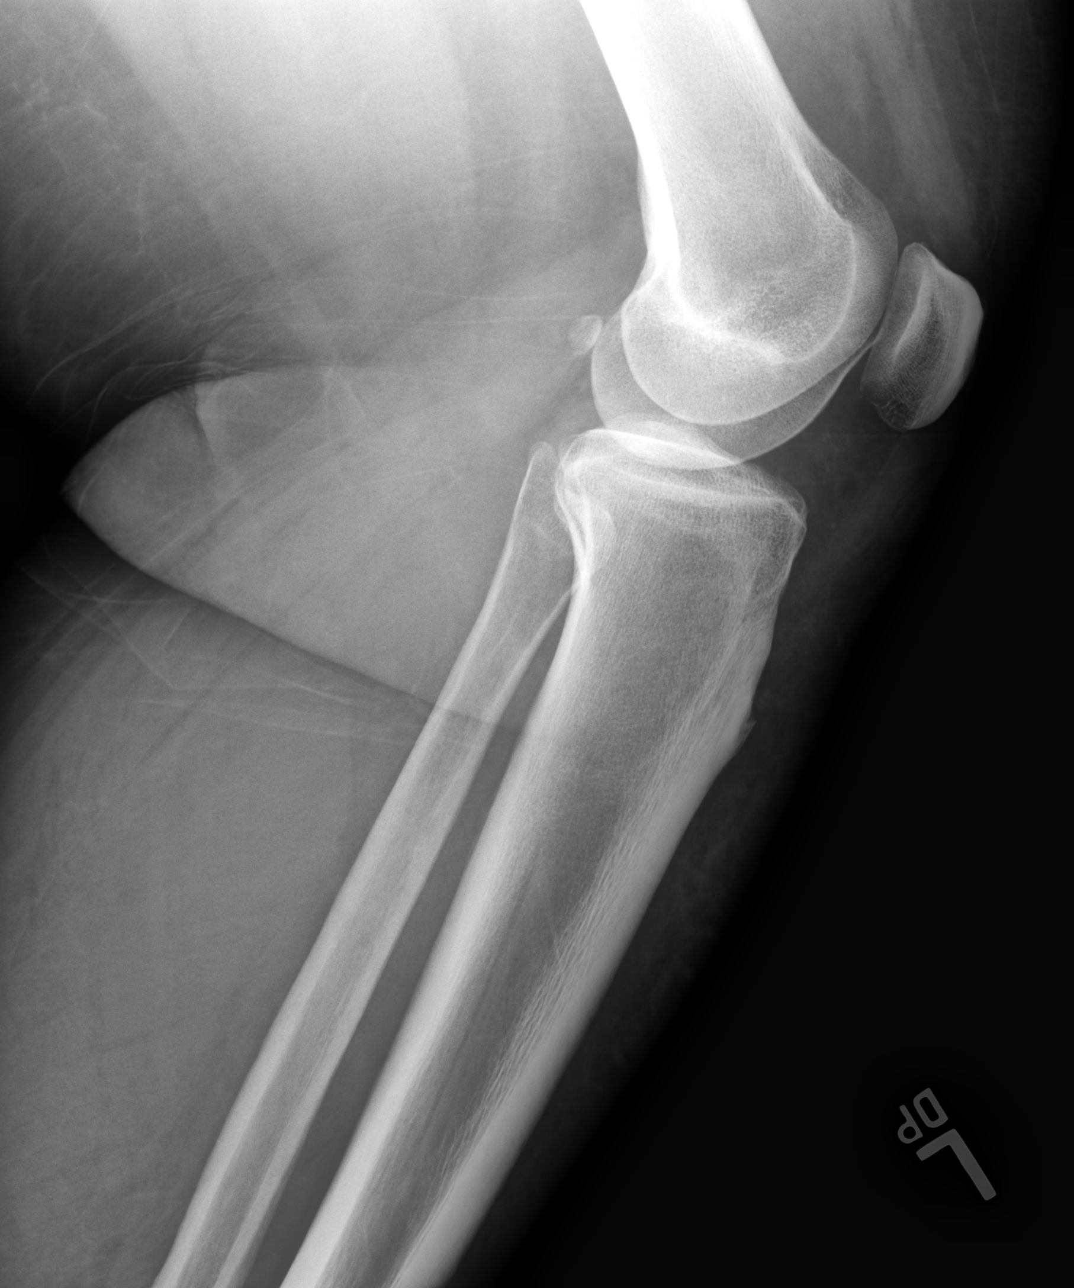

[4 of 4 positions shown; findings below may reference images not displayed]

FINDINGS: The bones are subjectively adequately mineralized. There is mild
narrowing of the medial joint compartment which is stable. There is
a new marginal osteophyte arising from the periphery of the medial
tibial plateau. There is minimal beaking of the tibial spines. There
is no acute fracture nor dislocation. There is no joint effusion.
IMPRESSION: Mild degenerative narrowing of the medial joint compartment with
medial tibial plateau marginal spur formation consistent with
osteoarthritis. No acute fracture or dislocation.

## 2017-04-04 ENCOUNTER — Encounter (HOSPITAL_BASED_OUTPATIENT_CLINIC_OR_DEPARTMENT_OTHER): Payer: 59

## 2017-04-05 ENCOUNTER — Ambulatory Visit: Payer: 59 | Admitting: Physician Assistant

## 2017-05-04 DIAGNOSIS — M5442 Lumbago with sciatica, left side: Secondary | ICD-10-CM | POA: Diagnosis not present

## 2017-05-07 ENCOUNTER — Ambulatory Visit: Payer: 59 | Admitting: Family Medicine

## 2017-11-19 ENCOUNTER — Encounter: Payer: Self-pay | Admitting: Family Medicine

## 2017-12-17 ENCOUNTER — Encounter: Payer: Self-pay | Admitting: Family Medicine

## 2017-12-17 ENCOUNTER — Ambulatory Visit: Payer: 59 | Admitting: Family Medicine

## 2017-12-17 VITALS — BP 140/80 | HR 74 | Wt >= 6400 oz

## 2017-12-17 DIAGNOSIS — I10 Essential (primary) hypertension: Secondary | ICD-10-CM | POA: Diagnosis not present

## 2017-12-17 LAB — BASIC METABOLIC PANEL
BUN: 11 mg/dL (ref 7–25)
CALCIUM: 8.9 mg/dL (ref 8.6–10.2)
CO2: 30 mmol/L (ref 20–32)
Chloride: 101 mmol/L (ref 98–110)
Creat: 0.98 mg/dL (ref 0.50–1.10)
Glucose, Bld: 93 mg/dL (ref 65–99)
POTASSIUM: 3.8 mmol/L (ref 3.5–5.3)
SODIUM: 141 mmol/L (ref 135–146)

## 2017-12-17 LAB — CBC WITH DIFFERENTIAL/PLATELET
BASOS ABS: 24 {cells}/uL (ref 0–200)
Basophils Relative: 0.3 %
EOS ABS: 142 {cells}/uL (ref 15–500)
Eosinophils Relative: 1.8 %
HEMATOCRIT: 37.8 % (ref 35.0–45.0)
HEMOGLOBIN: 12.6 g/dL (ref 11.7–15.5)
LYMPHS ABS: 2481 {cells}/uL (ref 850–3900)
MCH: 27.8 pg (ref 27.0–33.0)
MCHC: 33.3 g/dL (ref 32.0–36.0)
MCV: 83.3 fL (ref 80.0–100.0)
MPV: 10.3 fL (ref 7.5–12.5)
Monocytes Relative: 6.6 %
NEUTROS ABS: 4732 {cells}/uL (ref 1500–7800)
Neutrophils Relative %: 59.9 %
Platelets: 317 10*3/uL (ref 140–400)
RBC: 4.54 10*6/uL (ref 3.80–5.10)
RDW: 14.6 % (ref 11.0–15.0)
Total Lymphocyte: 31.4 %
WBC mixed population: 521 cells/uL (ref 200–950)
WBC: 7.9 10*3/uL (ref 3.8–10.8)

## 2017-12-17 LAB — TSH: TSH: 2.48 mIU/L

## 2017-12-17 MED ORDER — AMLODIPINE BESYLATE 5 MG PO TABS: 5.0000 mg | ORAL_TABLET | Freq: Every day | ORAL | 2 refills | Status: DC

## 2017-12-17 MED ORDER — HYDROCHLOROTHIAZIDE 12.5 MG PO CAPS: 12.5000 mg | ORAL_CAPSULE | Freq: Every day | ORAL | 2 refills | Status: DC

## 2017-12-17 NOTE — Patient Instructions (Addendum)
Your blood pressure today is 140/80 and not yet at goal. Goal BP is less than 130/80.  I am adding a second medication for your BP. Continue taking the HCTZ and add amlodipine.  Keep an eye on your BP and record the readings.   We will call with your lab results and have you follow up in 4 weeks.    DASH Eating Plan DASH stands for "Dietary Approaches to Stop Hypertension." The DASH eating plan is a healthy eating plan that has been shown to reduce high blood pressure (hypertension). It may also reduce your risk for type 2 diabetes, heart disease, and stroke. The DASH eating plan may also help with weight loss. What are tips for following this plan? General guidelines  Avoid eating more than 2,300 mg (milligrams) of salt (sodium) a day. If you have hypertension, you may need to reduce your sodium intake to 1,500 mg a day.  Limit alcohol intake to no more than 1 drink a day for nonpregnant women and 2 drinks a day for men. One drink equals 12 oz of beer, 5 oz of wine, or 1 oz of hard liquor.  Work with your health care provider to maintain a healthy body weight or to lose weight. Ask what an ideal weight is for you.  Get at least 30 minutes of exercise that causes your heart to beat faster (aerobic exercise) most days of the week. Activities may include walking, swimming, or biking.  Work with your health care provider or diet and nutrition specialist (dietitian) to adjust your eating plan to your individual calorie needs. Reading food labels  Check food labels for the amount of sodium per serving. Choose foods with less than 5 percent of the Daily Value of sodium. Generally, foods with less than 300 mg of sodium per serving fit into this eating plan.  To find whole grains, look for the word "whole" as the first word in the ingredient list. Shopping  Buy products labeled as "low-sodium" or "no salt added."  Buy fresh foods. Avoid canned foods and premade or frozen  meals. Cooking  Avoid adding salt when cooking. Use salt-free seasonings or herbs instead of table salt or sea salt. Check with your health care provider or pharmacist before using salt substitutes.  Do not fry foods. Cook foods using healthy methods such as baking, boiling, grilling, and broiling instead.  Cook with heart-healthy oils, such as olive, canola, soybean, or sunflower oil. Meal planning   Eat a balanced diet that includes: ? 5 or more servings of fruits and vegetables each day. At each meal, try to fill half of your plate with fruits and vegetables. ? Up to 6-8 servings of whole grains each day. ? Less than 6 oz of lean meat, poultry, or fish each day. A 3-oz serving of meat is about the same size as a deck of cards. One egg equals 1 oz. ? 2 servings of low-fat dairy each day. ? A serving of nuts, seeds, or beans 5 times each week. ? Heart-healthy fats. Healthy fats called Omega-3 fatty acids are found in foods such as flaxseeds and coldwater fish, like sardines, salmon, and mackerel.  Limit how much you eat of the following: ? Canned or prepackaged foods. ? Food that is high in trans fat, such as fried foods. ? Food that is high in saturated fat, such as fatty meat. ? Sweets, desserts, sugary drinks, and other foods with added sugar. ? Full-fat dairy products.  Do not salt  foods before eating.  Try to eat at least 2 vegetarian meals each week.  Eat more home-cooked food and less restaurant, buffet, and fast food.  When eating at a restaurant, ask that your food be prepared with less salt or no salt, if possible. What foods are recommended? The items listed may not be a complete list. Talk with your dietitian about what dietary choices are best for you. Grains Whole-grain or whole-wheat bread. Whole-grain or whole-wheat pasta. Brown rice. [place]. Bulgur. Whole-grain and low-sodium cereals. Pita bread. Low-fat, low-sodium crackers. Whole-wheat flour  tortillas. Vegetables Fresh or frozen vegetables (raw, steamed, roasted, or grilled). Low-sodium or reduced-sodium tomato and vegetable juice. Low-sodium or reduced-sodium tomato sauce and tomato paste. Low-sodium or reduced-sodium canned vegetables. Fruits All fresh, dried, or frozen fruit. Canned fruit in natural juice (without added sugar). Meat and other protein foods Skinless chicken or [place]. Ground chicken or [place]. Pork with fat trimmed off. Fish and seafood. Egg whites. Dried beans, peas, or lentils. Unsalted nuts, nut butters, and seeds. Unsalted canned beans. Lean cuts of beef with fat trimmed off. Low-sodium, lean deli meat. Dairy Low-fat (1%) or fat-free (skim) milk. Fat-free, low-fat, or reduced-fat cheeses. Nonfat, low-sodium ricotta or cottage cheese. Low-fat or nonfat yogurt. Low-fat, low-sodium cheese. Fats and oils Soft margarine without trans fats. Vegetable oil. Low-fat, reduced-fat, or light mayonnaise and salad dressings (reduced-sodium). Canola, safflower, olive, soybean, and sunflower oils. Avocado. Seasoning and other foods Herbs. Spices. Seasoning mixes without salt. Unsalted popcorn and pretzels. Fat-free sweets. What foods are not recommended? The items listed may not be a complete list. Talk with your dietitian about what dietary choices are best for you. Grains Baked goods made with fat, such as croissants, muffins, or some breads. Dry pasta or rice meal packs. Vegetables Creamed or fried vegetables. Vegetables in a cheese sauce. Regular canned vegetables (not low-sodium or reduced-sodium). Regular canned tomato sauce and paste (not low-sodium or reduced-sodium). Regular tomato and vegetable juice (not low-sodium or reduced-sodium). Angie Fava. Olives. Fruits Canned fruit in a light or heavy syrup. Fried fruit. Fruit in cream or butter sauce. Meat and other protein foods Fatty cuts of meat. Ribs. Fried meat. Berniece Salines. Sausage. Bologna and other processed lunch meats.  Salami. Fatback. Hotdogs. Bratwurst. Salted nuts and seeds. Canned beans with added salt. Canned or smoked fish. Whole eggs or egg yolks. Chicken or [place] with skin. Dairy Whole or 2% milk, cream, and half-and-half. Whole or full-fat cream cheese. Whole-fat or sweetened yogurt. Full-fat cheese. Nondairy creamers. Whipped toppings. Processed cheese and cheese spreads. Fats and oils Butter. Stick margarine. Lard. Shortening. Ghee. Bacon fat. Tropical oils, such as coconut, palm kernel, or palm oil. Seasoning and other foods Salted popcorn and pretzels. Onion salt, garlic salt, seasoned salt, table salt, and sea salt. Worcestershire sauce. Tartar sauce. Barbecue sauce. Teriyaki sauce. Soy sauce, including reduced-sodium. Steak sauce. Canned and packaged gravies. Fish sauce. Oyster sauce. Cocktail sauce. Horseradish that you find on the shelf. Ketchup. Mustard. Meat flavorings and tenderizers. Bouillon cubes. Hot sauce and Tabasco sauce. Premade or packaged marinades. Premade or packaged taco seasonings. Relishes. Regular salad dressings. Where to find more information:  National Heart, Lung, and [place]: https://wilson-eaton.com/  American Heart Association: www.heart.org Summary  The DASH eating plan is a healthy eating plan that has been shown to reduce high blood pressure (hypertension). It may also reduce your risk for type 2 diabetes, heart disease, and stroke.  With the DASH eating plan, you should limit salt (sodium) intake to 2,300  mg a day. If you have hypertension, you may need to reduce your sodium intake to 1,500 mg a day.  When on the DASH eating plan, aim to eat more fresh fruits and vegetables, whole grains, lean proteins, low-fat dairy, and heart-healthy fats.  Work with your health care provider or diet and nutrition specialist (dietitian) to adjust your eating plan to your individual calorie needs. This information is not intended to replace advice given to you by your health  care provider. Make sure you discuss any questions you have with your health care provider. Document Released: 11/26/2011 Document Revised: 11/30/2016 Document Reviewed: 11/30/2016 Elsevier Interactive Patient Education  [employer].

## 2017-12-17 NOTE — Progress Notes (Signed)
   Subjective:    Patient ID: Allison Mays, female    DOB: 1971-05-12, 46 y.o.   MRN: [id number]  HPI Chief Complaint  Patient presents with  . refill on b/p meds    pt needs a refill om b/p meds    She is here to follow-up on hypertension.  She is requesting a refill of blood pressure medication. She has not been here since February. Checks her BP sporadically.  BP at home has been elevated.  She has gained weight. Tearful about the fact that she would like to lose weight.  States she does not "have time" to take care of herself because she is taking care of her family. States she does not know how many calories she is eating. Feels like she stress eats. Does not exercise.   She saw cardiology and did not follow up as recommended.   Denies fever, chills, dizziness, chest pain, palpitations, shortness of breath, abdominal pain, N/V/D, urinary symptoms, LE edema.   Reviewed allergies, medications, past medical, surgical, family, and social history.   Review of Systems Pertinent positives and negatives in the history of present illness.     Objective:   Physical Exam BP 140/80   Pulse 74   Wt (!) 406 lb 3.2 oz (184.3 kg)   SpO2 96%   BMI 62.68 kg/m   Alert and in no distress. Pharyngeal area is normal. Neck is supple without adenopathy or thyromegaly. Cardiac exam shows a regular sinus rhythm without murmurs or gallops. Lungs are clear to auscultation.       Assessment & Plan:  Hypertension, unspecified type - Plan: CBC with Differential/Platelet, Basic metabolic panel, amLODipine (NORVASC) 5 MG tablet  Severe obesity (BMI >= 40) (HCC) - Plan: CBC with Differential/Platelet, Basic metabolic panel, TSH  Essential hypertension - Plan: hydrochlorothiazide (MICROZIDE) 12.5 MG capsule  Discussed that her blood pressure is not in goal range.  Plan to keep her on HCTZ 12.5 mg and add amlodipine 5 mg.  She will keep a close eye on her blood pressure.  We will check labs.  She  has not been following up with me or her cardiologist as recommended.  Reviewed cardiology note as well as her echocardiogram result with her.  She is aware that she is at an increased risk of developing heart failure, diabetes and other health problems.  She has gained weight since her last visit and states she would like to lose weight.  She became emotional when discussing this.  Offered options of counseling, nutrition referral, weight loss counseling referral.  Discussed downloading a free app called my fitness pal to help track her daily caloric intake. I think it is likely that she has sleep apnea however she refuses a sleep study.  Refuses counseling.  She agrees to let me refer her to [facility] counseling with Dr. Dalbert GarnetBeasley.  She will return in 4 weeks to follow-up on hypertension.

## 2018-01-17 ENCOUNTER — Ambulatory Visit: Payer: 59 | Admitting: Family Medicine

## 2018-01-28 ENCOUNTER — Ambulatory Visit: Payer: 59 | Admitting: Family Medicine

## 2018-01-28 ENCOUNTER — Encounter: Payer: Self-pay | Admitting: Family Medicine

## 2018-01-28 VITALS — BP 124/84 | HR 80 | Wt >= 6400 oz

## 2018-01-28 DIAGNOSIS — I10 Essential (primary) hypertension: Secondary | ICD-10-CM | POA: Diagnosis not present

## 2018-01-28 DIAGNOSIS — G44209 Tension-type headache, unspecified, not intractable: Secondary | ICD-10-CM | POA: Diagnosis not present

## 2018-01-28 NOTE — Progress Notes (Signed)
   Subjective:    Patient ID: Allison Mays, female    DOB: 20-Dec-1971, 47 y.o.   MRN: [id number]  HPI Chief Complaint  Patient presents with  . 1 moth follow-up    1 month follow-up on bp med   She is here for a 4 week follow up on HTN. We added amlodipine at her last visit. Reports not taking the HCTZ daily due to "forgetting" it sometimes and then not wanting to take it later in the day.   States she has had a mild dull posterior headache since last night. Ibuprofen 400 mg this morning. Dull aching bilaterally and her neck feels tight. Denies history of headaches. Has not taken anything this morning for this. No other symptoms.   She contacted weight loss counseling at [facility] and is on a waiting list and should have an appointment in April. She would be interested in hearing from [facility] regarding their weight loss study.   Denies fever, chills, vision changes, dizziness, chest pain, palpitations, shortness of breath, abdominal pain, N/V/D, urinary symptoms.   Reviewed allergies, medications, past medical, surgical, family, and social history.    Review of Systems Pertinent positives and negatives in the history of present illness.     Objective:   Physical Exam BP 124/84   Pulse 80   Wt (!) 408 lb 12.8 oz (185.4 kg)   BMI 63.08 kg/m   Alert and in no distress. PERRLA, CN II-IX intact. Skin is warm and dry, no pallor.       Assessment & Plan:  Hypertension, unspecified type  Acute non intractable tension-type headache  Severe obesity (BMI >= 40) (HCC)  Discussed that her blood pressure has improved since starting on amlodipine.  Encouraged her to take her medications daily as prescribed.  Encouraged her to eat a low-sodium diet.  She has gained another 2 pounds since her last visit.  She has made an effort to contact [facility] regarding her weight loss clinic.  I also offered her the option of calling [facility] or potentially enrolling in a study  with [facility] regarding weight loss.  She is receptive to these options. She appears to have a tension headache and discussed conservative treatment for this.  She will let me know if headaches become more frequent or she notices any worsening or new symptoms.  Otherwise I will see her back in 3 months for hypertension.

## 2018-01-28 NOTE — Patient Instructions (Addendum)
Try heat or ice on your neck, gentle stretches and Ibuprofen. You may also want to get someone to massage your neck.  If your headache gets worse or if you have any new symptoms then let me know.   Follow up if your blood pressure readings are not staying <130/80.  Eat a low salt diet.   You may hear from [facility] regarding their weight loss study.  You could also contact [facility] regarding their weight loss surgery if you are interested.   Follow up in 3 months for hypertension.    Tension Headache A tension headache is pain, pressure, or aching that is felt over the front and sides of your head. These headaches can last from 30 minutes to several days. Follow these instructions at home: Managing pain  Take over-the-counter and prescription medicines only as told by your doctor.  Lie down in a dark, quiet room when you have a headache.  If directed, apply ice to your head and neck area: ? Put ice in a plastic bag. ? Place a towel between your skin and the bag. ? Leave the ice on for 20 minutes, 2-3 times per day.  Use a heating pad or a hot shower to apply heat to your head and neck area as told by your doctor. Eating and drinking  Eat meals on a regular schedule.  Do not drink a lot of alcohol.  Do not use a lot of caffeine, or stop using caffeine. General instructions  Keep all follow-up visits as told by your doctor. This is important.  Keep a journal to find out if certain things bring on headaches. For example, write down: ? What you eat and drink. ? How much sleep you get. ? Any change to your diet or medicines.  Try getting a massage, or doing other things that help you to relax.  Lessen stress.  Sit up straight. Do not tighten (tense) your muscles.  Do not use tobacco products. This includes cigarettes, chewing tobacco, or e-cigarettes. If you need help quitting, ask your doctor.  Exercise regularly as told by your doctor.  Get enough  sleep. This may mean 7-9 hours of sleep. Contact a doctor if:  Your symptoms are not helped by medicine.  You have a headache that feels different from your usual headache.  You feel sick to your stomach (nauseous) or you throw up (vomit).  You have a fever. Get help right away if:  Your headache becomes very bad.  You keep throwing up.  You have a stiff neck.  You have trouble seeing.  You have trouble speaking.  You have pain in your eye or ear.  Your muscles are weak or you lose muscle control.  You lose your balance or you have trouble walking.  You feel like you will pass out (faint) or you pass out.  You have confusion. This information is not intended to replace advice given to you by your health care provider. Make sure you discuss any questions you have with your health care provider. Document Released: 03/03/2010 Document Revised: 08/06/2016 Document Reviewed: 04/01/2015 Elsevier Interactive Patient Education  [employer]2018 Elsevier Inc.

## 2018-04-25 ENCOUNTER — Ambulatory Visit: Payer: 59 | Admitting: Family Medicine

## 2018-05-26 ENCOUNTER — Encounter: Payer: Self-pay | Admitting: Family Medicine

## 2018-06-21 ENCOUNTER — Ambulatory Visit: Payer: 59 | Admitting: Family Medicine

## 2018-06-21 ENCOUNTER — Encounter: Payer: Self-pay | Admitting: Family Medicine

## 2018-06-21 VITALS — BP 122/82 | HR 70 | Ht 67.0 in | Wt 379.8 lb

## 2018-06-21 DIAGNOSIS — I10 Essential (primary) hypertension: Secondary | ICD-10-CM | POA: Diagnosis not present

## 2018-06-21 MED ORDER — AMLODIPINE BESYLATE 5 MG PO TABS: 5.0000 mg | ORAL_TABLET | Freq: Every day | ORAL | 2 refills | Status: DC

## 2018-06-21 MED ORDER — HYDROCHLOROTHIAZIDE 12.5 MG PO CAPS
12.5000 mg | ORAL_CAPSULE | Freq: Every day | ORAL | 2 refills | Status: DC
Start: 2018-06-21 — End: 2019-03-10

## 2018-06-21 NOTE — Patient Instructions (Signed)
Your BP today is 122/82 and in goal range. Continue on current medications and keep an eye on your BP at home.   Keep up the great work!!!!

## 2018-06-21 NOTE — Progress Notes (Signed)
   Subjective:    Patient ID: Allison Mays, female    DOB: [birth year], 47 y.o.   MRN: [id number]  HPI Chief Complaint  Patient presents with  . med check    med check   She is here to follow up on HTN. She has been out of her medication for the past 30 days. Checks BP at home weekly and has seen her readings in goal range or borderline. No new concerns or complaints today.   She has lost weight, approximately 27 lbs. She has been going to [facility] for the past 3 months and is involved in a weight loss study. Once weekly injections.  She is going to see them once monthly now. Had labs check this morning.   States she feels much better. Reports being able to walk a mile again and her knees and back are not hurting.  Walking more and using My Fitness Pal. Has cut her calories.   Denies fever, chills, dizziness, chest pain, palpitations, shortness of breath, abdominal pain, N/V/D, urinary symptoms, LE edema.   Reviewed allergies, medications, past medical, surgical, family, and social history.   Review of Systems Pertinent positives and negatives in the history of present illness.     Objective:   Physical Exam BP 122/82   Pulse 70   Ht 5\' 7"  (1.702 m)   Wt (!) 379 lb 12.8 oz (172.3 kg)   BMI 59.49 kg/m   Alert and oriented and in no acute distress. Not otherwise examined.       Assessment & Plan:  Essential hypertension - Plan: hydrochlorothiazide (MICROZIDE) 12.5 MG capsule, amLODipine (NORVASC) 5 MG tablet  Severe obesity (BMI >= 40) (HCC)  BP in goal range. Refilled medications.  Congratulated her on weight loss and making healthy lifestyle changes and encouraged her to keep going.  She is in great spirits and seeing the benefits of weight loss.  Follow up for fasting CPE in 3-4 months and recheck of HTN. She had labs this morning at [facility] and I will request of copy to scan into her chart.

## 2018-09-21 ENCOUNTER — Ambulatory Visit: Payer: Self-pay | Admitting: Family Medicine

## 2018-12-16 DIAGNOSIS — A0839 Other viral enteritis: Secondary | ICD-10-CM | POA: Diagnosis not present

## 2019-03-10 ENCOUNTER — Other Ambulatory Visit: Payer: Self-pay | Admitting: Internal Medicine

## 2019-03-10 ENCOUNTER — Encounter: Payer: Self-pay | Admitting: Family Medicine

## 2019-03-10 DIAGNOSIS — I10 Essential (primary) hypertension: Secondary | ICD-10-CM

## 2019-03-10 MED ORDER — AMLODIPINE BESYLATE 5 MG PO TABS: 5.0000 mg | ORAL_TABLET | Freq: Every day | ORAL | 0 refills | Status: DC

## 2019-03-10 MED ORDER — HYDROCHLOROTHIAZIDE 12.5 MG PO CAPS: 12.5000 mg | ORAL_CAPSULE | Freq: Every day | ORAL | 0 refills | Status: DC

## 2019-05-18 ENCOUNTER — Encounter: Payer: Self-pay | Admitting: Family Medicine

## 2019-05-18 DIAGNOSIS — Z87448 Personal history of other diseases of urinary system: Secondary | ICD-10-CM | POA: Insufficient documentation

## 2019-05-18 DIAGNOSIS — K760 Fatty (change of) liver, not elsewhere classified: Secondary | ICD-10-CM | POA: Insufficient documentation

## 2019-05-18 HISTORY — DX: Fatty (change of) liver, not elsewhere classified: K76.0

## 2019-05-18 HISTORY — DX: Personal history of other diseases of urinary system: Z87.448

## 2019-05-18 NOTE — Progress Notes (Signed)
   Subjective:   Documentation for virtual audio and video telecommunications through [employer] encounter:  The patient was located at home. 2 patient identifiers used.  The provider was located in the office. The patient did consent to this visit and is aware of possible charges through their insurance for this visit.  The other persons participating in this telemedicine service were none.    Patient ID: Allison Mays, female    DOB: 12-19-1971, 48 y.o.   MRN: [id number]  HPI Chief Complaint  Patient presents with  . med check    med check- needs refill. bp has been running normal   This is a medication management visit.   HTN-does not check her blood pressure at home. States her last reading was in the 140s systolic.  Reports she is only taking the HCTZ. States she stopped the amlodipine in March or April due to not calling to get a refill. Denies having side effects. Is willing to start back on amlodipine if needed.   Denies fever, chills, dizziness, chest pain, palpitations, shortness of breath, abdominal pain, N/V/D, urinary symptoms, LE edema.   Morbid obesity- she is in a new study with [facility].  She is using a once weekly injection given to her by [facility].   Fatty liver on [place] in the past.   History of hematuria- needs this rechecked.   LMP: hysterectomy 17 years ago.     Review of Systems Pertinent positives and negatives in the history of present illness.     Objective:   Physical Exam BP 126/80   Pulse 64   Temp 97.8 F (36.6 C) (Oral)   Wt (!) 389 lb 12.8 oz (176.8 kg)   BMI 61.05 kg/m   Alert and oriented in no acute distress.  Respirations unlabored.  Unable to perform a physical exam due to this being a virtual visit.      Assessment & Plan:  Essential hypertension - Plan: CBC with Differential/Platelet, Comprehensive metabolic panel, amLODipine (NORVASC) 5 MG tablet, hydrochlorothiazide (MICROZIDE) 12.5 MG capsule  Severe obesity (BMI >=  40) (HCC) - Plan: T4, free, TSH, Lipid panel, Hemoglobin A1c  Fatty liver - Plan: Comprehensive metabolic panel  History of hematuria - Plan: POCT Urinalysis DIP (Proadvantage Device)  Screening for lipid disorders - Plan: Lipid panel  Asymptomatic microscopic hematuria - Plan: Urine Microscopic, Urine Culture Discussed limitations of a virtual visit. She has not been checking her blood pressure at home. She stopped amlodipine for no particular reason. No side effects. States she just ran out. Is agreeable to start back.  Continue on HCTZ as well. Currently enrolled in a study with [facility] to help with weight loss. History of hematuria and this will need to be rechecked.  She denies any urinary symptoms. She will come in later this afternoon for labs, BP check and urinalysis. This visit was done in the setting of Covid-19.   Time spent on call was 15 minutes and in review of previous records 3 minutes total.  This virtual service is not related to other E/M service within previous 7 days.

## 2019-05-19 ENCOUNTER — Other Ambulatory Visit: Payer: Self-pay

## 2019-05-19 ENCOUNTER — Ambulatory Visit (INDEPENDENT_AMBULATORY_CARE_PROVIDER_SITE_OTHER): Payer: 59 | Admitting: Family Medicine

## 2019-05-19 VITALS — BP 126/80 | HR 64 | Temp 97.8°F | Wt 389.8 lb

## 2019-05-19 DIAGNOSIS — R3121 Asymptomatic microscopic hematuria: Secondary | ICD-10-CM

## 2019-05-19 DIAGNOSIS — I10 Essential (primary) hypertension: Secondary | ICD-10-CM | POA: Diagnosis not present

## 2019-05-19 DIAGNOSIS — Z87448 Personal history of other diseases of urinary system: Secondary | ICD-10-CM | POA: Diagnosis not present

## 2019-05-19 DIAGNOSIS — K760 Fatty (change of) liver, not elsewhere classified: Secondary | ICD-10-CM

## 2019-05-19 DIAGNOSIS — Z13220 Encounter for screening for lipoid disorders: Secondary | ICD-10-CM

## 2019-05-19 LAB — POCT URINALYSIS DIP (PROADVANTAGE DEVICE)
Bilirubin, UA: NEGATIVE
Glucose, UA: NEGATIVE mg/dL
Ketones, POC UA: NEGATIVE mg/dL
Leukocytes, UA: NEGATIVE
Nitrite, UA: NEGATIVE
Protein Ur, POC: NEGATIVE mg/dL
Specific Gravity, Urine: 1.015
Urobilinogen, Ur: NEGATIVE
pH, UA: 6 (ref 5.0–8.0)

## 2019-05-19 MED ORDER — AMLODIPINE BESYLATE 5 MG PO TABS: 5.0000 mg | ORAL_TABLET | Freq: Every day | ORAL | 3 refills | Status: DC

## 2019-05-19 MED ORDER — HYDROCHLOROTHIAZIDE 12.5 MG PO CAPS: 12.5000 mg | ORAL_CAPSULE | Freq: Every day | ORAL | 5 refills | Status: DC

## 2019-05-20 LAB — CBC WITH DIFFERENTIAL/PLATELET
Basophils Absolute: 0 10*3/uL (ref 0.0–0.2)
Basos: 0 %
EOS (ABSOLUTE): 0.1 10*3/uL (ref 0.0–0.4)
Eos: 2 %
Hematocrit: 39.6 % (ref 34.0–46.6)
Hemoglobin: 13.2 g/dL (ref 11.1–15.9)
Immature Grans (Abs): 0 10*3/uL (ref 0.0–0.1)
Immature Granulocytes: 0 %
Lymphocytes Absolute: 2.5 10*3/uL (ref 0.7–3.1)
Lymphs: 30 %
MCH: 27.6 pg (ref 26.6–33.0)
MCHC: 33.3 g/dL (ref 31.5–35.7)
MCV: 83 fL (ref 79–97)
Monocytes Absolute: 0.8 10*3/uL (ref 0.1–0.9)
Monocytes: 9 %
Neutrophils Absolute: 5 10*3/uL (ref 1.4–7.0)
Neutrophils: 59 %
Platelets: 335 10*3/uL (ref 150–450)
RBC: 4.78 x10E6/uL (ref 3.77–5.28)
RDW: 14.6 % (ref 11.7–15.4)
WBC: 8.5 10*3/uL (ref 3.4–10.8)

## 2019-05-20 LAB — URINE CULTURE

## 2019-05-20 LAB — TSH: TSH: 2.23 u[IU]/mL (ref 0.450–4.500)

## 2019-05-20 LAB — LIPID PANEL
Chol/HDL Ratio: 2.9 ratio (ref 0.0–4.4)
Cholesterol, Total: 159 mg/dL (ref 100–199)
HDL: 54 mg/dL (ref >39)
LDL Calculated: 70 mg/dL (ref 0–99)
Triglycerides: 174 mg/dL — ABNORMAL HIGH (ref 0–149)
VLDL Cholesterol Cal: 35 mg/dL (ref 5–40)

## 2019-05-20 LAB — COMPREHENSIVE METABOLIC PANEL
ALT: 20 IU/L (ref 0–32)
AST: 19 IU/L (ref 0–40)
Albumin/Globulin Ratio: 1.3 (ref 1.2–2.2)
Albumin: 4.2 g/dL (ref 3.8–4.8)
Alkaline Phosphatase: 78 IU/L (ref 39–117)
BUN/Creatinine Ratio: 16 (ref 9–23)
BUN: 14 mg/dL (ref 6–24)
Bilirubin Total: 0.2 mg/dL (ref 0.0–1.2)
CO2: 25 mmol/L (ref 20–29)
Calcium: 9.4 mg/dL (ref 8.7–10.2)
Chloride: 99 mmol/L (ref 96–106)
Creatinine, Ser: 0.9 mg/dL (ref 0.57–1.00)
GFR calc Af Amer: 89 mL/min/{1.73_m2} (ref >59)
GFR calc non Af Amer: 77 mL/min/{1.73_m2} (ref >59)
Globulin, Total: 3.2 g/dL (ref 1.5–4.5)
Glucose: 85 mg/dL (ref 65–99)
Potassium: 3.9 mmol/L (ref 3.5–5.2)
Sodium: 140 mmol/L (ref 134–144)
Total Protein: 7.4 g/dL (ref 6.0–8.5)

## 2019-05-20 LAB — URINALYSIS, MICROSCOPIC ONLY: Casts: NONE SEEN /lpf

## 2019-05-20 LAB — HEMOGLOBIN A1C
Est. average glucose Bld gHb Est-mCnc: 117 mg/dL
Hgb A1c MFr Bld: 5.7 % — ABNORMAL HIGH (ref 4.8–5.6)

## 2019-05-20 LAB — T4, FREE: Free T4: 0.93 ng/dL (ref 0.82–1.77)

## 2019-05-25 ENCOUNTER — Encounter: Payer: Self-pay | Admitting: Family Medicine

## 2019-08-21 ENCOUNTER — Ambulatory Visit: Payer: 59 | Admitting: Family Medicine

## 2019-08-21 ENCOUNTER — Encounter: Payer: Self-pay | Admitting: Family Medicine

## 2019-08-21 ENCOUNTER — Other Ambulatory Visit: Payer: Self-pay

## 2019-08-21 ENCOUNTER — Encounter: Payer: Self-pay | Admitting: Internal Medicine

## 2019-08-21 DIAGNOSIS — M25562 Pain in left knee: Secondary | ICD-10-CM

## 2019-08-21 DIAGNOSIS — G8929 Other chronic pain: Secondary | ICD-10-CM | POA: Diagnosis not present

## 2019-08-21 NOTE — Progress Notes (Signed)
   Subjective:   Documentation for virtual audio and video telecommunications through [place] encounter:  The patient was located at home. The provider was located in the office. The patient did consent to this visit and is aware of possible charges through their insurance for this visit.  The other persons participating in this telemedicine service were none.    Patient ID: Allison Mays, female    DOB: 12-Jun-1971, 48 y.o.   MRN: [id number]  HPI Chief Complaint  Patient presents with  . work note    needs work note to state ok to wear athletic shoes.    She is requesting I write a letter allowing her to wear athletic shoes to her job.  States she has been wearing supportive athletic shoes for several years.  Previously she was asked to provide a note stating that she needed to wear these so she did.  States this was written by her previous PCP approximately 8 years ago.  States she was recently asked by her employer to start wearing flat soled dress shoes.  States when she does wear these types of shoes it causes her to have significant knee pain and discomfort.  States other people she works with have taken advantage of the policy allowing them to wear athletic shoes by wearing unprofessional looking footwear and this has stemmed her employer to change the policy. Complains of chronic left knee arthritis.  She is working on weight loss and is in a study with [employer]. States her BP has been normal as her visits. No concerns.   Works at [employer]. Works in [employer] office.  Wearing thick cushion New Balance.   Denies fever, chills, dizziness, chest pain, palpitations, shortness of breath.   Reviewed allergies, medications, past medical, surgical, family, and social history.    Review of Systems Pertinent positives and negatives in the history of present illness.     Objective:   Physical Exam Wt (!) 391 lb (177.4 kg)   BMI 61.24 kg/m   Alert and oriented and in no  acute distress.  Respirations unlabored.  Unable to further examine due to this being a virtual visit.      Assessment & Plan:  Severe obesity (BMI >= 40) (HCC)  Chronic pain of left knee  Virtual visit today to discuss her need to wear supportive athletic shoes on her job.  Apparently she has been wearing new balance athletic shoes on her job for the past 8 years and recently her employer is attempting to have everyone wear a different type of shoe.  I will write a letter stating patient is requesting to wear athletic shoes due to chronic knee and foot pain and this type of shoe gives her the most relief of symptoms. Denies any concern in regards to safety on her job.

## 2019-11-03 ENCOUNTER — Other Ambulatory Visit: Payer: Self-pay | Admitting: Family Medicine

## 2019-11-03 DIAGNOSIS — I10 Essential (primary) hypertension: Secondary | ICD-10-CM

## 2019-12-06 ENCOUNTER — Other Ambulatory Visit: Payer: Self-pay | Admitting: Family Medicine

## 2019-12-06 DIAGNOSIS — I10 Essential (primary) hypertension: Secondary | ICD-10-CM

## 2020-01-22 ENCOUNTER — Encounter: Payer: Self-pay | Admitting: Family Medicine

## 2020-01-22 DEATH — deceased

## 2020-03-20 ENCOUNTER — Telehealth: Payer: Self-pay | Admitting: Family Medicine

## 2020-03-20 NOTE — Telephone Encounter (Signed)
I am sorry to hear that.

## 2020-03-20 NOTE — Telephone Encounter (Signed)
Allison Mays of patient showed her passing of 02-03-2020.
